# Patient Record
Sex: Male | Born: 1985 | Hispanic: Yes | Marital: Married | Smoking: Never smoker
Health system: Southern US, Community
[De-identification: ages and names within clinical notes are randomized; demographics above are authoritative.]

## PROBLEM LIST (undated history)

## (undated) DIAGNOSIS — J302 Other seasonal allergic rhinitis: Secondary | ICD-10-CM

## (undated) DIAGNOSIS — K091 Developmental (nonodontogenic) cysts of oral region: Secondary | ICD-10-CM

## (undated) DIAGNOSIS — I10 Essential (primary) hypertension: Secondary | ICD-10-CM

## (undated) HISTORY — PX: CYST EXCISION: SHX5701

## (undated) HISTORY — DX: Essential (primary) hypertension: I10

## (undated) HISTORY — PX: CRANIOTOMY: SHX93

---

## 2011-09-23 ENCOUNTER — Emergency Department (INDEPENDENT_AMBULATORY_CARE_PROVIDER_SITE_OTHER)
Admission: EM | Admit: 2011-09-23 | Discharge: 2011-09-23 | Disposition: A | Discharge status: Home / Self Care | Payer: Self-pay | Source: Home / Self Care | Attending: Emergency Medicine | Admitting: Emergency Medicine

## 2011-09-23 DIAGNOSIS — J45909 Unspecified asthma, uncomplicated: Secondary | ICD-10-CM

## 2011-09-23 MED ORDER — DOXYCYCLINE HYCLATE 100 MG PO TABS: 100.0000 mg | ORAL_TABLET | Freq: Two times a day (BID) | ORAL | Status: AC

## 2011-09-23 MED ORDER — GUAIFENESIN-CODEINE 100-10 MG/5ML PO SYRP
10.0000 mL | ORAL_SOLUTION | Freq: Four times a day (QID) | ORAL | Status: AC | PRN
Start: 2011-09-23 — End: 2011-10-03

## 2011-09-23 MED ORDER — PREDNISONE 10 MG PO TABS: ORAL_TABLET | ORAL | Status: DC

## 2011-09-23 MED ORDER — ALBUTEROL SULFATE HFA 108 (90 BASE) MCG/ACT IN AERS: 1.0000 | INHALATION_SPRAY | Freq: Four times a day (QID) | RESPIRATORY_TRACT | Status: DC | PRN

## 2011-09-23 NOTE — ED Notes (Signed)
C/o runny nose, slight cough and feels like it is hard to get deep breath for 2 days.  States initially he had sorethroat but that resolved.  Denies fever, n/v or diarrhea.

## 2011-09-23 NOTE — ED Provider Notes (Signed)
History     CSN: [id number]  Arrival date & time 09/23/11  1736   First MD Initiated Contact with Patient 09/23/11 1744      Chief Complaint  Patient presents with  . URI    (Consider location/radiation/quality/duration/timing/severity/associated sxs/prior treatment) HPI Comments: Sasan has had a two-day history of chest congestion, dry cough, wheezing, sore throat, nasal congestion, and rhinorrhea. He denies fever, chills, headache, chest pain, nausea, vomiting, or diarrhea. He has not been exposed to flu. He has not had a flu vaccine this year.  Patient is a 25 y.o. male presenting with URI.  URI The primary symptoms include sore throat and cough. Primary symptoms do not include fever, fatigue, ear pain, wheezing, abdominal pain, nausea, vomiting or rash.  Symptoms associated with the illness include congestion and rhinorrhea. The illness is not associated with chills.    History reviewed. No pertinent past medical history.  Past Surgical History  Procedure Date  . Brain surgery     History reviewed. No pertinent family history.  History  Substance Use Topics  . Smoking status: Current Some Day Smoker  . Smokeless tobacco: Not on file  . Alcohol Use: Yes      Review of Systems  Constitutional: Negative for fever, chills and fatigue.  HENT: Positive for congestion, sore throat and rhinorrhea. Negative for ear pain, sneezing, neck stiffness, voice change and postnasal drip.   Eyes: Negative for pain, discharge and redness.  Respiratory: Positive for cough and chest tightness. Negative for shortness of breath and wheezing.   Gastrointestinal: Negative for nausea, vomiting, abdominal pain and diarrhea.  Skin: Negative for rash.    Allergies  Review of patient's allergies indicates no known allergies.  Home Medications   Current Outpatient Rx  Name Route Sig Dispense Refill  . ALBUTEROL SULFATE HFA 108 (90 BASE) MCG/ACT IN AERS Inhalation Inhale 1-2 puffs into  the lungs every 6 (six) hours as needed for wheezing. 1 Inhaler 0  . DOXYCYCLINE HYCLATE 100 MG PO TABS Oral Take 1 tablet (100 mg total) by mouth 2 (two) times daily. 20 tablet 0  . GUAIFENESIN-CODEINE 100-10 MG/5ML PO SYRP Oral Take 10 mLs by mouth 4 (four) times daily as needed for cough. 120 mL 0  . PREDNISONE 10 MG PO TABS  Take 4 tabs daily for 4 days, 3 tabs daily for 4 days, 2 tabs daily for 4 days, then 1 tab daily for 4 days.  Take all tabs at one time with food and preferably in the morning except for the first dose. 40 tablet 0    BP 147/93  Pulse 97  Temp(Src) 98.6 F (37 C) (Oral)  Resp 18  SpO2 99%  Physical Exam  Nursing note and vitals reviewed. Constitutional: He appears well-developed and well-nourished. No distress.  HENT:  Head: Normocephalic and atraumatic.  Right Ear: External ear normal.  Left Ear: External ear normal.  Nose: Nose normal.  Mouth/Throat: Oropharynx is clear and moist. No oropharyngeal exudate.  Eyes: Conjunctivae and EOM are normal. Pupils are equal, round, and reactive to light. Right eye exhibits no discharge. Left eye exhibits no discharge.  Neck: Normal range of motion. Neck supple.  Cardiovascular: Normal rate, regular rhythm and normal heart sounds.   Pulmonary/Chest: Effort normal and breath sounds normal. No stridor. No respiratory distress. He has no wheezes. He has no rales. He exhibits no tenderness.  Lymphadenopathy:    He has no cervical adenopathy.  Skin: Skin is warm and dry. No rash  noted. He is not diaphoretic.    ED Course  Procedures (including critical care time)  Labs Reviewed - No data to display No results found.   1. Asthmatic bronchitis       MDM  He has asthmatic bronchitis. Will treat with doxycycline, guaifenesin/codeine cough syrup, albuterol, and a prednisone taper.        Roque Lias, MD 09/23/11 2107

## 2011-11-08 ENCOUNTER — Other Ambulatory Visit: Payer: Self-pay | Admitting: Family Medicine

## 2011-11-08 ENCOUNTER — Ambulatory Visit
Admission: RE | Admit: 2011-11-08 | Discharge: 2011-11-08 | Disposition: A | Discharge status: Home / Self Care | Payer: No Typology Code available for payment source | Source: Ambulatory Visit | Attending: Family Medicine | Admitting: Family Medicine

## 2011-11-08 DIAGNOSIS — R0602 Shortness of breath: Secondary | ICD-10-CM

## 2012-01-06 ENCOUNTER — Encounter: Payer: Self-pay | Admitting: Pulmonary Disease

## 2012-01-09 ENCOUNTER — Ambulatory Visit (INDEPENDENT_AMBULATORY_CARE_PROVIDER_SITE_OTHER): Payer: Self-pay | Admitting: Pulmonary Disease

## 2012-01-09 ENCOUNTER — Ambulatory Visit (INDEPENDENT_AMBULATORY_CARE_PROVIDER_SITE_OTHER)
Admission: RE | Admit: 2012-01-09 | Discharge: 2012-01-09 | Disposition: A | Discharge status: Home / Self Care | Payer: Self-pay | Source: Ambulatory Visit | Attending: Pulmonary Disease | Admitting: Pulmonary Disease

## 2012-01-09 ENCOUNTER — Encounter: Payer: Self-pay | Admitting: Pulmonary Disease

## 2012-01-09 VITALS — BP 122/72 | HR 97 | Temp 97.5°F | Ht 69.0 in | Wt 218.4 lb

## 2012-01-09 DIAGNOSIS — R0609 Other forms of dyspnea: Secondary | ICD-10-CM

## 2012-01-09 DIAGNOSIS — R0600 Dyspnea, unspecified: Secondary | ICD-10-CM

## 2012-01-09 DIAGNOSIS — R0989 Other specified symptoms and signs involving the circulatory and respiratory systems: Secondary | ICD-10-CM

## 2012-01-09 NOTE — Assessment & Plan Note (Signed)
He has chronic dyspnea since an episode of bronchitis in December 2012.  He has been tried on asthma therapy without much improvement.  There is no history of smoking or tobacco exposure.  His symptoms do not appear to be interfering with his daily activities too much.  Therefore will hold of on starting additional therapy at this time until more testing is done.  Will repeat chest xray today and schedule pulmonary functions tests.  Of note is that he reports starting lisinopril several months ago, and trial of this medicine may be warranted to see if this improves his symptoms.  Will defer this decision to primary care.

## 2012-01-09 NOTE — Progress Notes (Deleted)
  Subjective:    Patient ID: Ryan Perez, male    DOB: 11/22/1985, 26 y.o.   MRN: [id number]  HPI    Review of Systems  Constitutional: Negative for fever and appetite change.  HENT: Negative for ear pain, congestion, sore throat, trouble swallowing and dental problem.   Respiratory: Negative for cough and shortness of breath.   Cardiovascular: Negative for chest pain, palpitations and leg swelling.  Gastrointestinal: Negative for nausea and abdominal pain.  Musculoskeletal: Negative for joint swelling.  Skin: Negative for rash.  Neurological: Negative for headaches.  Psychiatric/Behavioral: Negative for dysphoric mood. The patient is not nervous/anxious.        Objective:   Physical Exam        Assessment & Plan:

## 2012-01-09 NOTE — Patient Instructions (Signed)
Chest xray today>>will call you with results Will schedule breathing test (PFT) Follow up in 2 to 3 weeks

## 2012-01-09 NOTE — Progress Notes (Signed)
Chief Complaint  Patient presents with  . Advice Only    Per Dr. Manus Gunning. Pt states its hard for him to take a deep breathe in x4 months. denies any pain when taking deep breathe.     History of Present Illness: Ryan Perez is a 26 y.o. male for evaluation of dyspnea.  His symptoms started in December of 2012.  He was seen in the ED for chest congestion, cough, sinus congestion, and wheezing.  He was treated for a bronchitis with prednisone taper and doxycycline.  Since then he has felt like he can't take deep breaths.  He has been tried on inhaler therapy and prednisone again since then, but these did not seem to help.  He is not having much cough or wheeze at present.  He denies chest pain or palpitations.  He has noticed trouble taking deep breaths and gets coughing spells when he runs.  This was not a problem before December 2012.  He was recently on a dieting aide, and this helped him lose 20 lbs.  This did not make any improvement with his breathing.  He has noticed some sinus congestion with change of seasons.  He denies globus sensation or reflux.  He denies joint swelling, gland swelling, or skin rash.  He denies smoking, and denies second hand tobacco exposures.  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  He is from [place], but has been living in [place] for 13 years.  He traveled to [place] last year, but denies other travel.  He denies recent sick exposures.  Of note is he was started on lisinopril for hypertension about 3 or 4 months ago.   Past Medical History  Diagnosis Date  . Hypertension     Past Surgical History  Procedure Date  . Brain surgery   . Nasal sinus surgery 2010    Current Outpatient Prescriptions on File Prior to Visit  Medication Sig Dispense Refill  . cetirizine (ZYRTEC) 10 MG tablet Take 10 mg by mouth daily.      Marland Kitchen lisinopril (PRINIVIL,ZESTRIL) 20 MG tablet Take 20 mg by mouth daily.          No Known Allergies  family history includes Diabetes in his father.   reports that he has never smoked. He has never used smokeless tobacco. He reports that he drinks alcohol. He reports that he does not use illicit drugs.  Review of Systems  Constitutional: Negative for fever and appetite change.  HENT: Negative for ear pain, congestion, sore throat, trouble swallowing and dental problem.   Respiratory: Negative for cough and shortness of breath.   Cardiovascular: Negative for chest pain, palpitations and leg swelling.  Gastrointestinal: Negative for nausea and abdominal pain.  Musculoskeletal: Negative for joint swelling.  Skin: Negative for rash.  Neurological: Negative for headaches.  Psychiatric/Behavioral: Negative for dysphoric mood. The patient is not nervous/anxious.     Physical Exam: BP 122/72  Pulse 97  Temp(Src) 97.5 F (36.4 C) (Oral)  Ht 5\' 9"  (1.753 m)  Wt 218 lb 6.4 oz (99.066 kg)  BMI 32.25 kg/m2  SpO2 97%  Body mass index is 32.25 kg/(m^2).  General - Obese HEENT - PERRLA, EOMI, no sinus tenderness, nasal septal deviation, no oral exudate, MP 4, no LAN, no thyromegaly Cardiac - s1s2 regular, no murmur, pulses symmetric Chest - good air entry, normal respiratory excursion, no wheeze/rales/dullness Abdomen - soft, nontender, + bowel sounds, no organomegaly Extremities -  no e/c/c Neurologic - normal strength, CN intact Skin - no rashes Psychiatric - normal mood, behavior  Assessment/Plan:  Outpatient Encounter Prescriptions as of 01/09/2012  Medication Sig Dispense Refill  . cetirizine (ZYRTEC) 10 MG tablet Take 10 mg by mouth daily.      Marland Kitchen lisinopril (PRINIVIL,ZESTRIL) 20 MG tablet Take 20 mg by mouth daily.      Marland Kitchen DISCONTD: albuterol (PROVENTIL HFA;VENTOLIN HFA) 108 (90 BASE) MCG/ACT inhaler Inhale 1-2 puffs into the lungs every 6 (six) hours as needed for wheezing.  1 Inhaler  0  . DISCONTD: FLUOXETINE HCL PO Take 10 mg by mouth daily.      Marland Kitchen  DISCONTD: predniSONE (DELTASONE) 10 MG tablet Take 4 tabs daily for 4 days, 3 tabs daily for 4 days, 2 tabs daily for 4 days, then 1 tab daily for 4 days.  Take all tabs at one time with food and preferably in the morning except for the first dose.  40 tablet  0    Ryan Perez Pager:  [phone] 01/09/2012, 10:06 AM

## 2012-01-11 ENCOUNTER — Telehealth: Payer: Self-pay | Admitting: Pulmonary Disease

## 2012-01-11 NOTE — Telephone Encounter (Signed)
Dg Chest 2 View  01/09/2012  *RADIOLOGY REPORT*   Clinical Data: Shortness of breath which is progressive over the last 4 months, no smoking history   CHEST - 2 VIEW   Comparison: Chest x-ray of 11/08/2011  Findings: No pneumonia is seen.  There is some peribronchial thickening which may indicate bronchitis.  Mediastinal contours are stable.  The heart is within normal limits in size.  No bony abnormality is seen.   IMPRESSION: No active lung disease.  Mild peribronchial thickening.   Original Report Authenticated By: Juline Patch, M.D.    Will have my nurse inform patient that chest xray okay.  Will discuss further at next visit after review of PFT's.  No change to current treatment plan.

## 2012-01-11 NOTE — Telephone Encounter (Signed)
I spoke with patient about results and he verbalized understanding and had no questions 

## 2012-02-01 ENCOUNTER — Ambulatory Visit: Payer: Self-pay | Admitting: Pulmonary Disease

## 2012-12-03 ENCOUNTER — Ambulatory Visit (HOSPITAL_COMMUNITY)
Admission: RE | Admit: 2012-12-03 | Discharge: 2012-12-03 | Disposition: A | Discharge status: Home / Self Care | Payer: Self-pay | Source: Ambulatory Visit | Attending: Pediatrics | Admitting: Pediatrics

## 2012-12-03 ENCOUNTER — Other Ambulatory Visit (HOSPITAL_COMMUNITY): Payer: Self-pay | Admitting: Pediatrics

## 2012-12-03 DIAGNOSIS — R05 Cough: Secondary | ICD-10-CM

## 2012-12-03 DIAGNOSIS — R059 Cough, unspecified: Secondary | ICD-10-CM

## 2014-07-10 ENCOUNTER — Other Ambulatory Visit: Payer: Self-pay | Admitting: Otolaryngology

## 2014-07-10 ENCOUNTER — Other Ambulatory Visit (HOSPITAL_COMMUNITY): Payer: Self-pay | Admitting: Otolaryngology

## 2014-07-10 DIAGNOSIS — J341 Cyst and mucocele of nose and nasal sinus: Secondary | ICD-10-CM

## 2014-07-15 ENCOUNTER — Ambulatory Visit (HOSPITAL_COMMUNITY)
Admission: RE | Admit: 2014-07-15 | Discharge: 2014-07-15 | Disposition: A | Discharge status: Home / Self Care | Payer: No Typology Code available for payment source | Source: Ambulatory Visit | Attending: Otolaryngology | Admitting: Otolaryngology

## 2014-07-15 DIAGNOSIS — J341 Cyst and mucocele of nose and nasal sinus: Secondary | ICD-10-CM | POA: Diagnosis present

## 2014-12-04 IMAGING — CT CT MAXILLOFACIAL W/O CM
3 series · 16 of 47 positions shown, 19 images · non-contrast
Comparison: None.

CLINICAL DATA: Nasal cavity cyst

EXAM:
CT MAXILLOFACIAL WITHOUT CONTRAST
TECHNIQUE: Multidetector CT imaging of the maxillofacial structures was
performed. Multiplanar CT image reconstructions were also generated.
A small metallic BB was placed on the right temple in order to
reliably differentiate right from left.

[Series 2: facial/ orbits 2.0 h30s · axial · 0.42mm/px · z∈[-148,-6]mm · 10 of 83 slices shown, 13 images]
[im 6/83  brain]
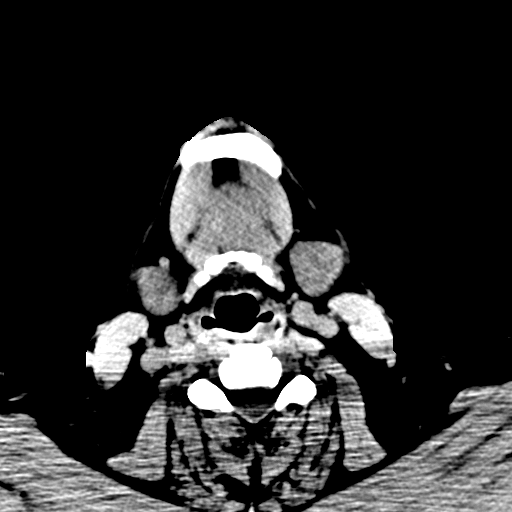
[im 6/83  bone]
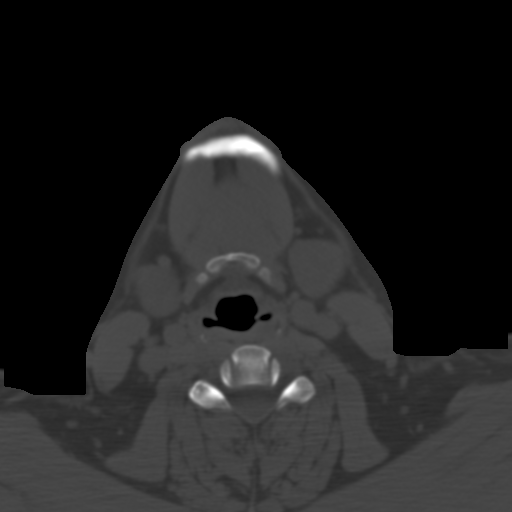
[im 15/83  bone]
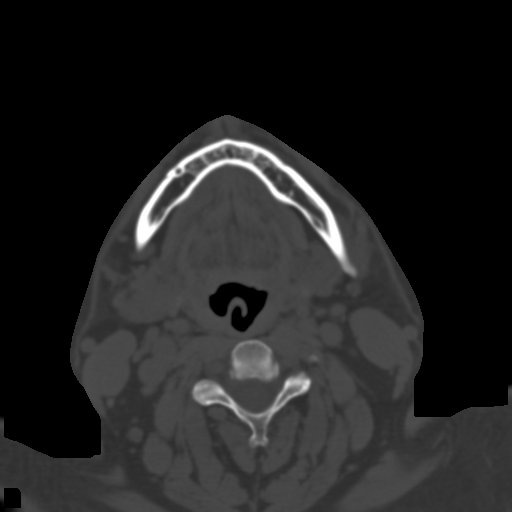
[im 23/83  bone]
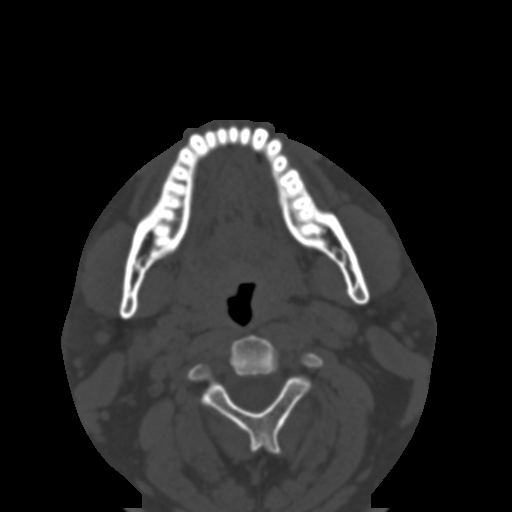
[im 29/83  bone]
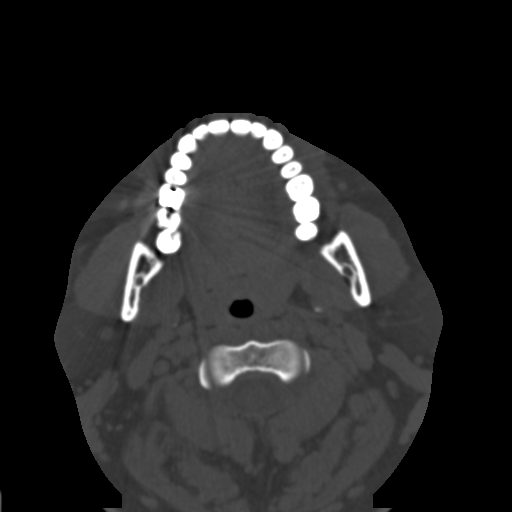
[im 37/83  brain]
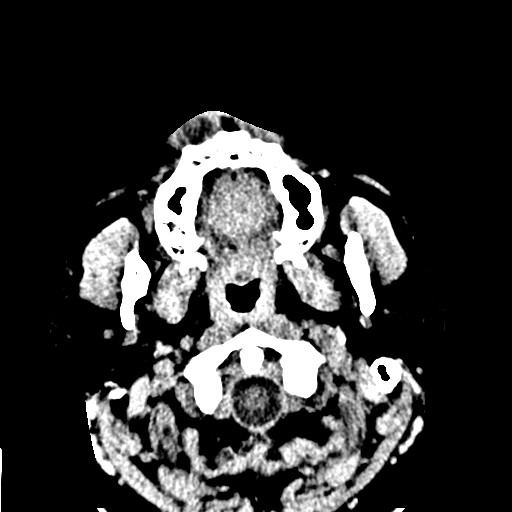
[im 37/83  bone]
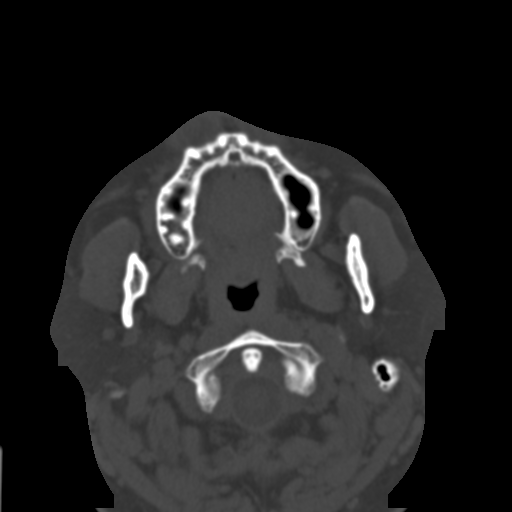
[im 46/83  bone]
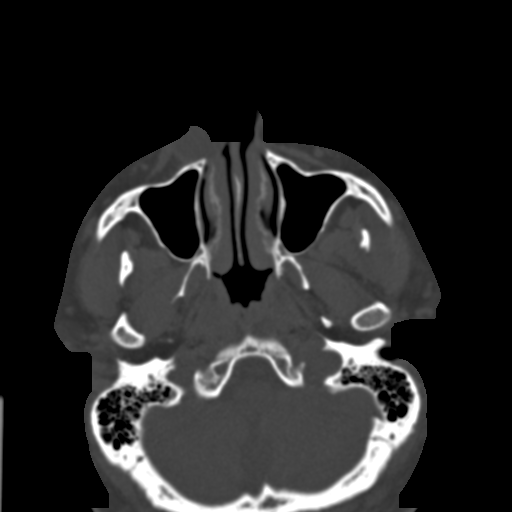
[im 54/83  bone]
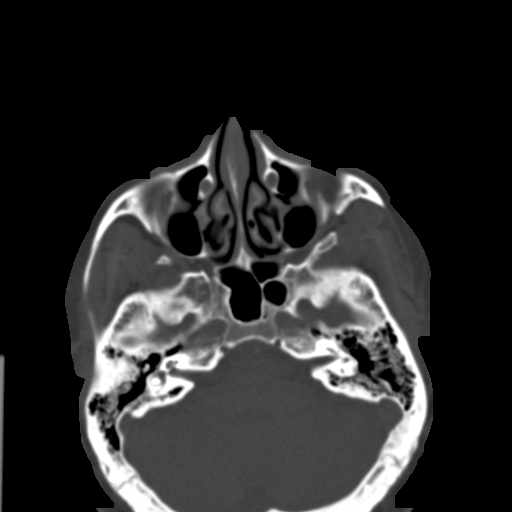
[im 63/83  bone]
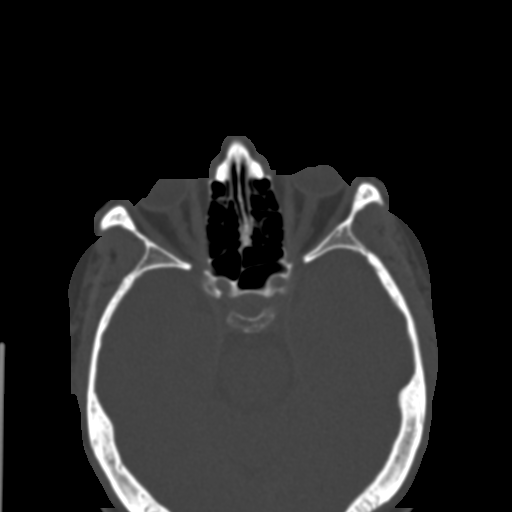
[im 68/83  brain]
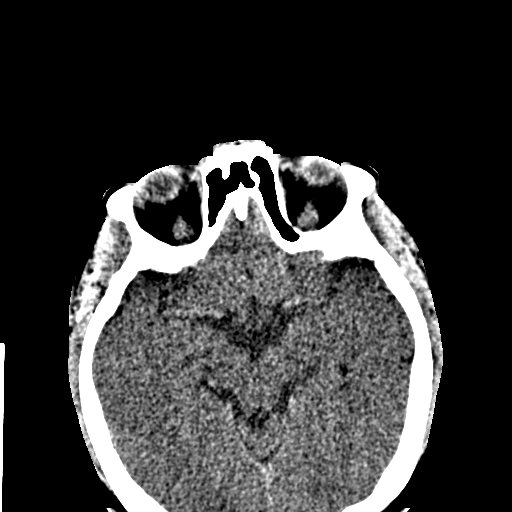
[im 68/83  bone]
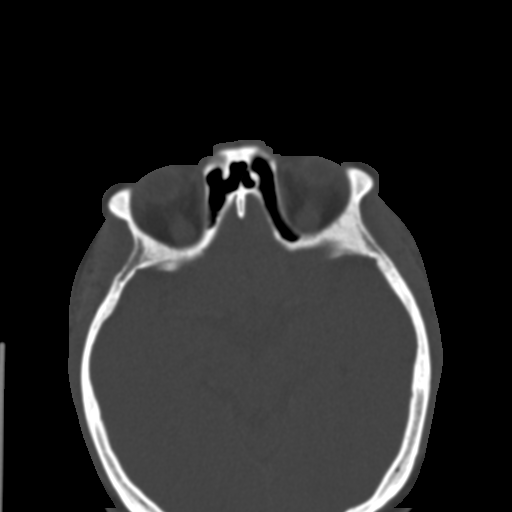
[im 77/83  bone]
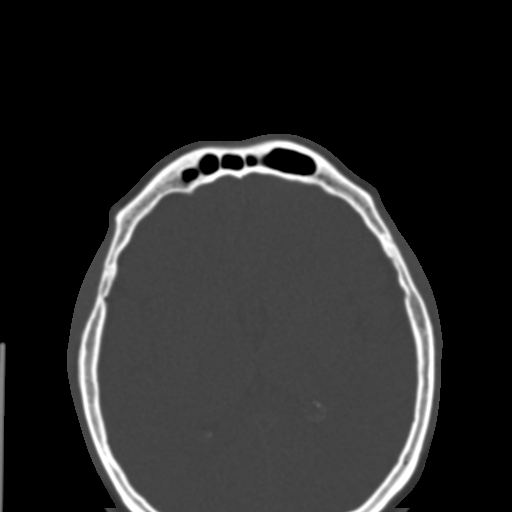

[Series 6: coronal soft tissue · coronal · 0.38mm/px · 3 of 80 slices shown]
[im 27/80  bone]
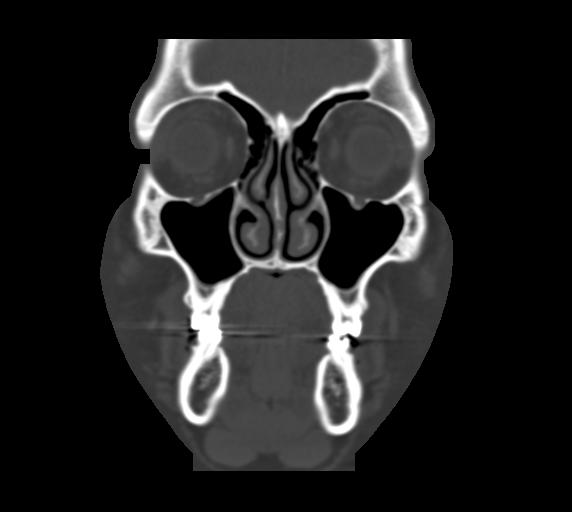
[im 36/80  bone]
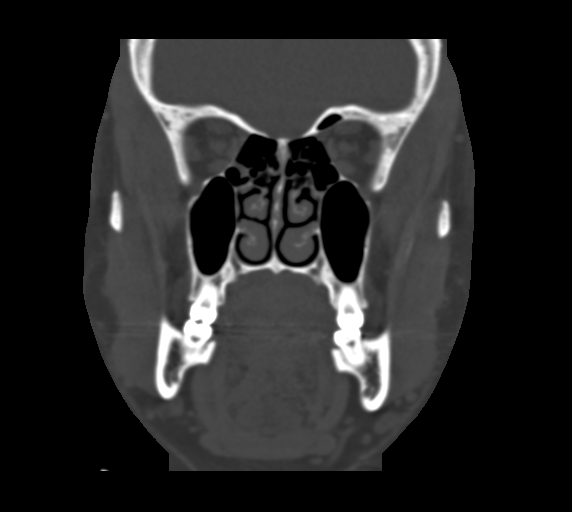
[im 44/80  bone]
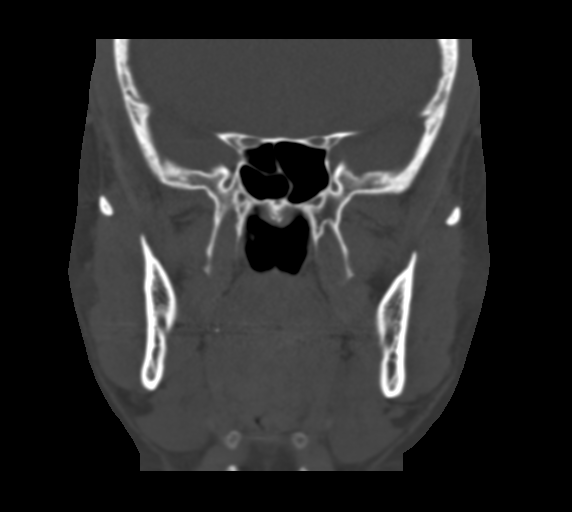

[Series 7: sagittal soft tissue · sagittal · 0.37mm/px · 3 of 87 slices shown]
[im 29/87  bone]
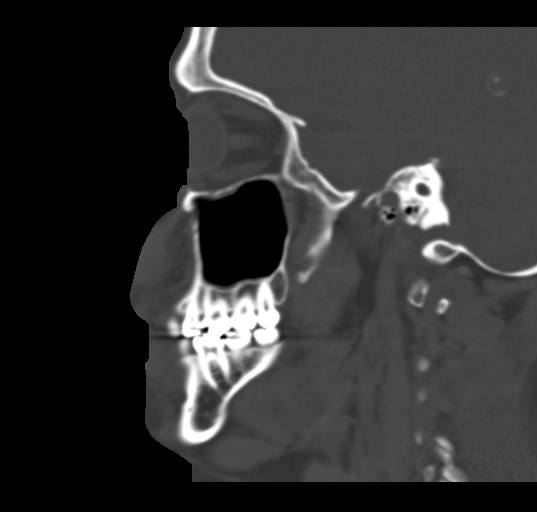
[im 44/87  bone]
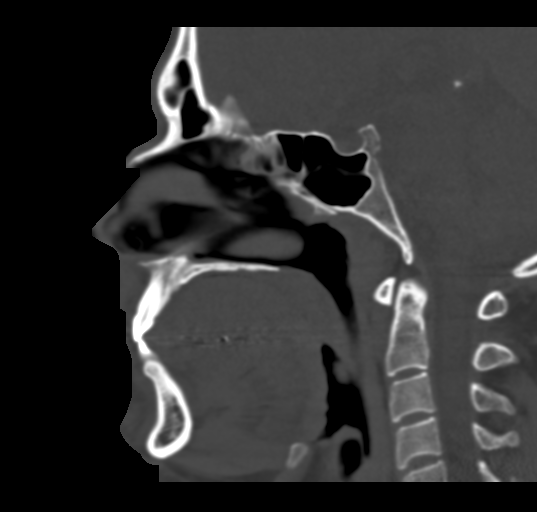
[im 58/87  bone]
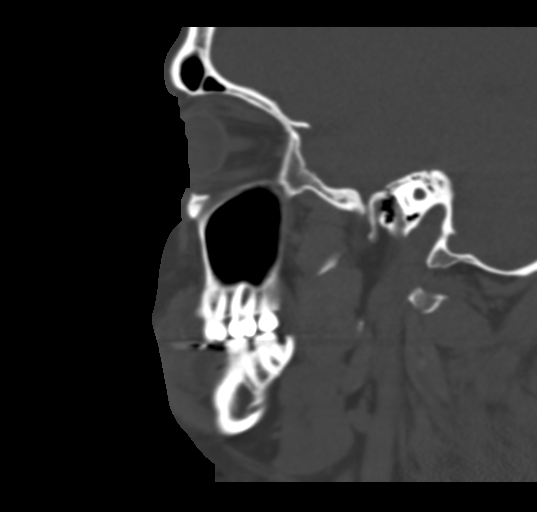

[16 of 47 positions shown; findings below may reference images not displayed]

FINDINGS: Subcutaneous cyst adjacent to the right nares measures 22 mm and has
homogeneous water density. No associated calcification. This cyst
projects into the right nasal cavity. No bony destruction or bony
remodeling is noted. Nasal septum is normal.

Mild mucosal edema in the maxillary and ethmoid sinuses bilaterally.
Frontal and sphenoid sinuses are clear. No air-fluid level. Mastoid
sinus clear bilaterally.
IMPRESSION: Subcutaneous cyst adjacent to the right nares with projection into
the right nasal cavity. No bony changes identified. This may
represent a sebaceous cyst or other benign dermal cyst.

Mild mucosal edema paranasal sinuses.

## 2015-02-25 DIAGNOSIS — K091 Developmental (nonodontogenic) cysts of oral region: Secondary | ICD-10-CM

## 2015-02-25 HISTORY — DX: Developmental (nonodontogenic) cysts of oral region: K09.1

## 2015-03-03 ENCOUNTER — Encounter (HOSPITAL_BASED_OUTPATIENT_CLINIC_OR_DEPARTMENT_OTHER): Payer: Self-pay | Admitting: *Deleted

## 2015-03-03 NOTE — Pre-Procedure Instructions (Signed)
To come for BMET and EKG 

## 2015-03-04 ENCOUNTER — Other Ambulatory Visit: Payer: Self-pay

## 2015-03-04 ENCOUNTER — Encounter (HOSPITAL_BASED_OUTPATIENT_CLINIC_OR_DEPARTMENT_OTHER)
Admission: RE | Admit: 2015-03-04 | Discharge: 2015-03-04 | Disposition: A | Discharge status: Home / Self Care | Payer: 59 | Source: Ambulatory Visit | Attending: Otolaryngology | Admitting: Otolaryngology

## 2015-03-04 DIAGNOSIS — Z01818 Encounter for other preprocedural examination: Secondary | ICD-10-CM | POA: Insufficient documentation

## 2015-03-04 LAB — BASIC METABOLIC PANEL
Anion gap: 7 (ref 5–15)
BUN: 9 mg/dL (ref 6–20)
CALCIUM: 9.5 mg/dL (ref 8.9–10.3)
CO2: 25 mmol/L (ref 22–32)
Chloride: 105 mmol/L (ref 101–111)
Creatinine, Ser: 0.71 mg/dL (ref 0.61–1.24)
GFR calc Af Amer: >60 mL/min (ref >60)
GFR calc non Af Amer: >60 mL/min (ref >60)
GLUCOSE: 139 mg/dL — AB (ref 65–99)
Potassium: 4.4 mmol/L (ref 3.5–5.1)
Sodium: 137 mmol/L (ref 135–145)

## 2015-03-06 NOTE — Pre-Procedure Instructions (Signed)
Dorita will be interpreter for pt., per Judy at [facility]; please call [phone] if surgery time changes. 

## 2015-03-09 ENCOUNTER — Ambulatory Visit (HOSPITAL_BASED_OUTPATIENT_CLINIC_OR_DEPARTMENT_OTHER)
Admission: RE | Admit: 2015-03-09 | Discharge: 2015-03-09 | Disposition: A | Discharge status: Home / Self Care | Payer: 59 | Source: Ambulatory Visit | Attending: Otolaryngology | Admitting: Otolaryngology

## 2015-03-09 ENCOUNTER — Ambulatory Visit (HOSPITAL_BASED_OUTPATIENT_CLINIC_OR_DEPARTMENT_OTHER): Payer: 59 | Admitting: Anesthesiology

## 2015-03-09 ENCOUNTER — Encounter (HOSPITAL_BASED_OUTPATIENT_CLINIC_OR_DEPARTMENT_OTHER)
Admission: RE | Disposition: A | Discharge status: Home / Self Care | Payer: Self-pay | Source: Ambulatory Visit | Attending: Otolaryngology

## 2015-03-09 ENCOUNTER — Encounter (HOSPITAL_BASED_OUTPATIENT_CLINIC_OR_DEPARTMENT_OTHER): Payer: Self-pay

## 2015-03-09 DIAGNOSIS — I10 Essential (primary) hypertension: Secondary | ICD-10-CM | POA: Diagnosis not present

## 2015-03-09 DIAGNOSIS — J341 Cyst and mucocele of nose and nasal sinus: Secondary | ICD-10-CM | POA: Insufficient documentation

## 2015-03-09 HISTORY — DX: Other seasonal allergic rhinitis: J30.2

## 2015-03-09 HISTORY — DX: Developmental (nonodontogenic) cysts of oral region: K09.1

## 2015-03-09 HISTORY — PX: EXCISION NASAL MASS: SHX6271

## 2015-03-09 LAB — POCT HEMOGLOBIN-HEMACUE: HEMOGLOBIN: 14.7 g/dL (ref 13.0–17.0)

## 2015-03-09 SURGERY — EXCISION, MASS, NOSE
Anesthesia: General | Site: Mouth | Laterality: Right

## 2015-03-09 MED ORDER — FENTANYL CITRATE (PF) 100 MCG/2ML IJ SOLN
INTRAMUSCULAR | Status: AC
  Filled 2015-03-09: qty 6

## 2015-03-09 MED ORDER — OXYCODONE HCL 5 MG PO TABS
5.0000 mg | ORAL_TABLET | Freq: Once | ORAL | Status: AC
  Administered 2015-03-09: 5 mg via ORAL

## 2015-03-09 MED ORDER — PROPOFOL 10 MG/ML IV BOLUS
INTRAVENOUS | Status: DC | PRN
  Administered 2015-03-09: 300 mg via INTRAVENOUS
  Administered 2015-03-09: 30 mg via INTRAVENOUS

## 2015-03-09 MED ORDER — MIDAZOLAM HCL 2 MG/2ML IJ SOLN
INTRAMUSCULAR | Status: AC
  Filled 2015-03-09: qty 2

## 2015-03-09 MED ORDER — FENTANYL CITRATE (PF) 100 MCG/2ML IJ SOLN: 50.0000 ug | INTRAMUSCULAR | Status: DC | PRN

## 2015-03-09 MED ORDER — HYDROCODONE-ACETAMINOPHEN 5-325 MG PO TABS: 1.0000 | ORAL_TABLET | Freq: Four times a day (QID) | ORAL | Status: DC | PRN

## 2015-03-09 MED ORDER — OXYMETAZOLINE HCL 0.05 % NA SOLN
NASAL | Status: DC | PRN
  Administered 2015-03-09: 1 via TOPICAL

## 2015-03-09 MED ORDER — BACITRACIN ZINC 500 UNIT/GM EX OINT
TOPICAL_OINTMENT | CUTANEOUS | Status: AC
  Filled 2015-03-09: qty 28.35

## 2015-03-09 MED ORDER — GLYCOPYRROLATE 0.2 MG/ML IJ SOLN: 0.2000 mg | Freq: Once | INTRAMUSCULAR | Status: DC | PRN

## 2015-03-09 MED ORDER — CEFAZOLIN SODIUM-DEXTROSE 2-3 GM-% IV SOLR
INTRAVENOUS | Status: DC | PRN
  Administered 2015-03-09: 2 g via INTRAVENOUS

## 2015-03-09 MED ORDER — MIDAZOLAM HCL 5 MG/5ML IJ SOLN
INTRAMUSCULAR | Status: DC | PRN
  Administered 2015-03-09: 2 mg via INTRAVENOUS

## 2015-03-09 MED ORDER — LACTATED RINGERS IV SOLN
INTRAVENOUS | Status: DC
Start: 2015-03-09 — End: 2015-03-09
  Administered 2015-03-09 (×3): via INTRAVENOUS

## 2015-03-09 MED ORDER — PROMETHAZINE HCL 25 MG/ML IJ SOLN: 6.2500 mg | INTRAMUSCULAR | Status: DC | PRN

## 2015-03-09 MED ORDER — ONDANSETRON HCL 4 MG/2ML IJ SOLN
INTRAMUSCULAR | Status: DC | PRN
  Administered 2015-03-09: 4 mg via INTRAVENOUS

## 2015-03-09 MED ORDER — OXYMETAZOLINE HCL 0.05 % NA SOLN
NASAL | Status: AC
  Filled 2015-03-09: qty 15

## 2015-03-09 MED ORDER — MUPIROCIN 2 % EX OINT
TOPICAL_OINTMENT | CUTANEOUS | Status: AC
  Filled 2015-03-09: qty 22

## 2015-03-09 MED ORDER — DEXAMETHASONE SODIUM PHOSPHATE 4 MG/ML IJ SOLN
INTRAMUSCULAR | Status: DC | PRN
  Administered 2015-03-09: 10 mg via INTRAVENOUS

## 2015-03-09 MED ORDER — MIDAZOLAM HCL 2 MG/2ML IJ SOLN: 0.5000 mg | Freq: Once | INTRAMUSCULAR | Status: DC | PRN

## 2015-03-09 MED ORDER — FENTANYL CITRATE (PF) 100 MCG/2ML IJ SOLN
INTRAMUSCULAR | Status: DC | PRN
  Administered 2015-03-09: 100 ug via INTRAVENOUS

## 2015-03-09 MED ORDER — EPHEDRINE SULFATE 50 MG/ML IJ SOLN
INTRAMUSCULAR | Status: DC | PRN
  Administered 2015-03-09: 10 mg via INTRAVENOUS

## 2015-03-09 MED ORDER — LIDOCAINE-EPINEPHRINE 1 %-1:100000 IJ SOLN
INTRAMUSCULAR | Status: AC
  Filled 2015-03-09: qty 1

## 2015-03-09 MED ORDER — LIDOCAINE HCL (CARDIAC) 20 MG/ML IV SOLN
INTRAVENOUS | Status: DC | PRN
  Administered 2015-03-09: 50 mg via INTRAVENOUS

## 2015-03-09 MED ORDER — LIDOCAINE-EPINEPHRINE 1 %-1:100000 IJ SOLN
INTRAMUSCULAR | Status: DC | PRN
  Administered 2015-03-09: 4 mL

## 2015-03-09 MED ORDER — HYDROMORPHONE HCL 1 MG/ML IJ SOLN: 0.2500 mg | INTRAMUSCULAR | Status: DC | PRN

## 2015-03-09 MED ORDER — BACITRACIN ZINC 500 UNIT/GM EX OINT
TOPICAL_OINTMENT | CUTANEOUS | Status: DC | PRN
  Administered 2015-03-09: 1 via TOPICAL

## 2015-03-09 MED ORDER — MEPERIDINE HCL 25 MG/ML IJ SOLN: 6.2500 mg | INTRAMUSCULAR | Status: DC | PRN

## 2015-03-09 MED ORDER — CEPHALEXIN 500 MG PO CAPS: 500.0000 mg | ORAL_CAPSULE | Freq: Three times a day (TID) | ORAL | Status: DC

## 2015-03-09 MED ORDER — MIDAZOLAM HCL 2 MG/2ML IJ SOLN: 1.0000 mg | INTRAMUSCULAR | Status: DC | PRN

## 2015-03-09 MED ORDER — OXYCODONE HCL 5 MG PO TABS
ORAL_TABLET | ORAL | Status: AC
  Filled 2015-03-09: qty 1

## 2015-03-09 SURGICAL SUPPLY — 40 items
ATTRACTOMAT 16X20 MAGNETIC DRP (DRAPES) IMPLANT
CANISTER SUCT 1200ML W/VALVE (MISCELLANEOUS) ×2 IMPLANT
COAGULATOR SUCT SWTCH 10FR 6 (ELECTROSURGICAL) IMPLANT
CORDS BIPOLAR (ELECTRODE) IMPLANT
DECANTER SPIKE VIAL GLASS SM (MISCELLANEOUS) IMPLANT
DRAIN PENROSE 1/4X12 LTX STRL (WOUND CARE) ×2 IMPLANT
DRSG TELFA 3X8 NADH (GAUZE/BANDAGES/DRESSINGS) IMPLANT
ELECT COATED BLADE 2.86 ST (ELECTRODE) ×2 IMPLANT
ELECT NEEDLE BLADE 2-5/6 (NEEDLE) IMPLANT
ELECT REM PT RETURN 9FT ADLT (ELECTROSURGICAL) ×2
ELECTRODE REM PT RTRN 9FT ADLT (ELECTROSURGICAL) ×1 IMPLANT
GLOVE BIO SURGEON STRL SZ7.5 (GLOVE) ×2 IMPLANT
GLOVE BIOGEL PI IND STRL 7.0 (GLOVE) ×1 IMPLANT
GLOVE BIOGEL PI INDICATOR 7.0 (GLOVE) ×1
GLOVE ECLIPSE 6.5 STRL STRAW (GLOVE) ×2 IMPLANT
GLOVE EXAM NITRILE EXT CUFF MD (GLOVE) ×2 IMPLANT
GOWN STRL REUS W/ TWL LRG LVL3 (GOWN DISPOSABLE) ×2 IMPLANT
GOWN STRL REUS W/TWL LRG LVL3 (GOWN DISPOSABLE) ×2
HEMOSTAT SURGICEL .5X2 ABSORB (HEMOSTASIS) IMPLANT
NEEDLE PRECISIONGLIDE 27X1.5 (NEEDLE) ×2 IMPLANT
NS IRRIG 1000ML POUR BTL (IV SOLUTION) ×2 IMPLANT
PACK BASIN DAY SURGERY FS (CUSTOM PROCEDURE TRAY) ×2 IMPLANT
PACK ENT DAY SURGERY (CUSTOM PROCEDURE TRAY) ×2 IMPLANT
PATTIES SURGICAL .5 X3 (DISPOSABLE) ×2 IMPLANT
PENCIL BUTTON HOLSTER BLD 10FT (ELECTRODE) ×2 IMPLANT
SHEET SILASTIC 8X6X.030 25-30 (MISCELLANEOUS) IMPLANT
SPLINT NASAL AIRWAY SILICONE (MISCELLANEOUS) IMPLANT
SPONGE GAUZE 2X2 8PLY STRL LF (GAUZE/BANDAGES/DRESSINGS) ×2 IMPLANT
SUT CHROMIC 2 0 SH (SUTURE) IMPLANT
SUT CHROMIC 4 0 P 3 18 (SUTURE) IMPLANT
SUT ETHILON 3 0 PS 1 (SUTURE) IMPLANT
SUT MON AB 3-0 SH 27 (SUTURE) ×1
SUT MON AB 3-0 SH27 (SUTURE) ×1 IMPLANT
SUT PLAIN 4 0 ~~LOC~~ 1 (SUTURE) IMPLANT
SUT SILK 2 0 SH (SUTURE) ×2 IMPLANT
SUT VIC AB 3-0 SH 27 (SUTURE) ×1
SUT VIC AB 3-0 SH 27X BRD (SUTURE) ×1 IMPLANT
TOWEL OR 17X24 6PK STRL BLUE (TOWEL DISPOSABLE) ×4 IMPLANT
TRAY DSU PREP LF (CUSTOM PROCEDURE TRAY) ×2 IMPLANT
YANKAUER SUCT BULB TIP NO VENT (SUCTIONS) IMPLANT

## 2015-03-09 NOTE — Brief Op Note (Signed)
03/09/2015  11:42 AM  PATIENT:  Ryan Perez  29 y.o. male  PRE-OPERATIVE DIAGNOSIS:  LATERAL NASAL CYST  POST-OPERATIVE DIAGNOSIS:  LATERAL NASAL CYST  PROCEDURE:  Procedure(s) with comments: SUBLABIAL EXCISION OF LATERAL NASAL CYST  (Right) - nasal and oral  SURGEON:  Surgeon(s) and Role:    * Christia Reading, MD - Primary  PHYSICIAN ASSISTANT:   ASSISTANTS: none   ANESTHESIA:   general  EBL:  Total I/O In: 2000 [I.V.:2000] Out: -   BLOOD ADMINISTERED:none  DRAINS: Penrose drain in the sublabial   LOCAL MEDICATIONS USED:  LIDOCAINE   SPECIMEN:  Source of Specimen:  Right lateral nasal cyst  DISPOSITION OF SPECIMEN:  PATHOLOGY  COUNTS:  YES  TOURNIQUET:  * No tourniquets in log *  DICTATION: .Other Dictation: Dictation Number [id number]  PLAN OF CARE: Discharge to home after PACU  PATIENT DISPOSITION:  PACU - hemodynamically stable.   Delay start of Pharmacological VTE agent (>24hrs) due to surgical blood loss or risk of bleeding: no

## 2015-03-09 NOTE — Anesthesia Procedure Notes (Signed)
Procedure Name: LMA Insertion Date/Time: 03/09/2015 10:37 AM Performed by: Genevieve Norlander L Pre-anesthesia Checklist: Patient identified, Emergency Drugs available, Suction available and Patient being monitored Patient Re-evaluated:Patient Re-evaluated prior to inductionOxygen Delivery Method: Circle System Utilized Preoxygenation: Pre-oxygenation with 100% oxygen Intubation Type: IV induction Ventilation: Mask ventilation without difficulty LMA: LMA inserted LMA Size: 5.0 Number of attempts: 1 Airway Equipment and Method: Bite block Placement Confirmation: positive ETCO2 Tube secured with: Tape Dental Injury: Teeth and Oropharynx as per pre-operative assessment

## 2015-03-09 NOTE — H&P (Signed)
Ryan Perez is an 29 y.o. male.   Chief Complaint: Right lateral nasal cyst HPI: 29 year old with right lateral nasal cyst that was removed previously in [place] but recurred.  There was drainage from the right nasal passage with recurrence.  Underlying bony structures appear normal on CT imaging.  He presents for sublabial excision.  Past Medical History  Diagnosis Date  . Hypertension     states under control with med., has been on med. > 3 yr.  . Seasonal allergies   . Cyst, nasolabial 02/2015    right lateral    Past Surgical History  Procedure Laterality Date  . Craniotomy  age 36    head trauma - fell from horse  . Cyst excision Right     nose    Family History  Problem Relation Age of Onset  . Diabetes Father    Social History:  reports that he has never smoked. He has never used smokeless tobacco. He reports that he drinks alcohol. He reports that he does not use illicit drugs.  Allergies: No Known Allergies  Medications Prior to Admission  Medication Sig Dispense Refill  . lisinopril (PRINIVIL,ZESTRIL) 20 MG tablet Take 20 mg by mouth daily.      Results for orders placed or performed during the hospital encounter of 03/09/15 (from the past 48 hour(s))  Hemoglobin-hemacue, POC     Status: None   Collection Time: 03/09/15  9:11 AM  Result Value Ref Range   Hemoglobin 14.7 13.0 - 17.0 g/dL   No results found.  Review of Systems  All other systems reviewed and are negative.   Blood pressure 150/82, pulse 90, temperature 98.1 F (36.7 C), temperature source Oral, resp. rate 20, height 5\' 9"  (1.753 m), weight 106.505 kg (234 lb 12.8 oz), SpO2 100 %. Physical Exam  Constitutional: He is oriented to person, place, and time. He appears well-developed and well-nourished. No distress.  HENT:  Head: Normocephalic and atraumatic.  Right Ear: External ear normal.  Left Ear: External ear normal.  Mouth/Throat: Oropharynx is clear and moist.  Right medial  cheek/lateral nose with mild fullness, right nasal ala mildly deformed.  Eyes: Conjunctivae and EOM are normal. Pupils are equal, round, and reactive to light.  Neck: Normal range of motion. Neck supple.  Cardiovascular: Normal rate.   Respiratory: Effort normal.  Musculoskeletal: Normal range of motion.  Neurological: He is alert and oriented to person, place, and time. No cranial nerve deficit.  Skin: Skin is warm and dry.  Psychiatric: He has a normal mood and affect. His behavior is normal. Judgment and thought content normal.     Assessment/Plan Right lateral nasal cyst To OR for sublabial excision of right lateral nasal cyst.  Anapaola Kinsel 03/09/2015, 10:22 AM

## 2015-03-09 NOTE — Transfer of Care (Signed)
Immediate Anesthesia Transfer of Care Note  Patient: Ryan Perez  Procedure(s) Performed: Procedure(s) with comments: SUBLABIAL EXCISION OF LATERAL NASAL CYST  (Right) - nasal and oral  Patient Location: PACU  Anesthesia Type:General  Level of Consciousness: awake and patient cooperative  Airway & Oxygen Therapy: Patient Spontanous Breathing and Patient connected to face mask oxygen  Post-op Assessment: Report given to RN and Post -op Vital signs reviewed and stable  Post vital signs: Reviewed and stable  Last Vitals:  Filed Vitals:   03/09/15 0901  BP: 150/82  Pulse: 90  Temp: 36.7 C  Resp: 20    Complications: No apparent anesthesia complications

## 2015-03-09 NOTE — Discharge Instructions (Signed)

## 2015-03-09 NOTE — Anesthesia Preprocedure Evaluation (Addendum)
Anesthesia Evaluation  Patient identified by MRN, date of birth, ID band Patient awake    Reviewed: Allergy & Precautions, NPO status , Patient's Chart, lab work & pertinent test results  History of Anesthesia Complications Negative for: history of anesthetic complications  Airway Mallampati: III  TM Distance: >3 FB Neck ROM: Full    Dental  (+) Dental Advisory Given   Pulmonary neg pulmonary ROS,  breath sounds clear to auscultation        Cardiovascular hypertension, Pt. on medications Rhythm:Regular Rate:Normal     Neuro/Psych S/p craniotomy for trauma age 29yo    GI/Hepatic negative GI ROS, Neg liver ROS,   Endo/Other  Morbid obesity  Renal/GU negative Renal ROS     Musculoskeletal   Abdominal (+) + obese,   Peds  Hematology negative hematology ROS (+)   Anesthesia Other Findings   Reproductive/Obstetrics                            Anesthesia Physical Anesthesia Plan  ASA: II  Anesthesia Plan: General   Post-op Pain Management:    Induction: Intravenous  Airway Management Planned: LMA  Additional Equipment:   Intra-op Plan:   Post-operative Plan:   Informed Consent: I have reviewed the patients History and Physical, chart, labs and discussed the procedure including the risks, benefits and alternatives for the proposed anesthesia with the patient or authorized representative who has indicated his/her understanding and acceptance.   Dental advisory given  Plan Discussed with: CRNA and Surgeon  Anesthesia Plan Comments: (Plan routine monitors, GA- LMA OK)        Anesthesia Quick Evaluation

## 2015-03-09 NOTE — Anesthesia Postprocedure Evaluation (Signed)
  Anesthesia Post-op Note  Patient: Ryan Perez  Procedure(s) Performed: Procedure(s) with comments: SUBLABIAL EXCISION OF LATERAL NASAL CYST  (Right) - nasal and oral  Patient Location: PACU  Anesthesia Type:General  Level of Consciousness: awake, alert , oriented and patient cooperative  Airway and Oxygen Therapy: Patient Spontanous Breathing  Post-op Pain: mild  Post-op Assessment: Post-op Vital signs reviewed, Patient's Cardiovascular Status Stable, Respiratory Function Stable, Patent Airway, No signs of Nausea or vomiting and Pain level controlled              Post-op Vital Signs: Reviewed and stable  Last Vitals:  Filed Vitals:   03/09/15 1245  BP: 136/78  Pulse: 81  Temp:   Resp: 13    Complications: No apparent anesthesia complications

## 2015-03-10 ENCOUNTER — Encounter (HOSPITAL_BASED_OUTPATIENT_CLINIC_OR_DEPARTMENT_OTHER): Payer: Self-pay | Admitting: Otolaryngology

## 2015-03-10 NOTE — Op Note (Signed)
NAME:  DUFF, POZZI NO.:  [id number]  MEDICAL RECORD NO.:  [id number]  LOCATION:                               FACILITY:  [facility]  PHYSICIAN:  Antony Contras, MD     DATE OF BIRTH:  March 21, 1986  DATE OF PROCEDURE:  03/09/2015 DATE OF DISCHARGE:  03/09/2015                              OPERATIVE REPORT   PREOPERATIVE DIAGNOSIS:  Right lateral nasal cyst.  POSTOPERATIVE DIAGNOSIS:  Right lateral nasal cyst.  PROCEDURE:  Sublabial excision of right lateral nasal cyst with intranasal excision.  SURGEON:  Antony Contras, MD  ANESTHESIA:  General LMA anesthesia.  COMPLICATIONS:  None.  INDICATION:  The patient is a 29 year old male who has had a cyst in the right lateral nasal wall and premaxilla for quite some time.  He underwent an excision in [place] that helped for some time, but the cyst recurred and draining within the nose during that time.  With persistence of the cyst, he presents to the operating room for surgical management.  FINDINGS:  There was a soft fluid collection in the premaxilla that was palpable through the gingivobuccal sulcus superiorly as well as protruded into the lateral nasal wall.  Excision involved removal of an island of mucosa from the lateral nasal wall along with the cyst which dissected freely from deep structures.  DESCRIPTION OF PROCEDURE:  The patient was identified in the holding room.  Informed consent having been obtained after discussion of risks, benefits, alternatives, the patient was brought to the operative suite, and put on the operative table in a supine position.  Anesthesia was induced.  The patient was intubated by the anesthesia team with an LMA without difficulty.  The patient was given intravenous antibiotics during the case.  The left eye was taped closed and the right eye was lubricated.  The face was prepped and draped in sterile fashion.  The right superior gingivobuccal sulcus was injected with 1%  lidocaine with 1:100,000 epinephrine as well as the lateral nasal wall anteriorly.  An incision was made through the mucosa of the gingivobuccal sulcus using a 15-blade scalpel and then Bovie electrocautery was then used to dissect down to the edge of the cyst and around its capsule keeping along the edge of the capsule without penetrating the cyst.  The cystic structure elevated off the maxilla without difficulty and was dissected free from surrounding tissues up towards the lateral nasal wall.  It was able to be dissected free from the alar crease without penetrating the skin. The mucosa was then incised within the nose around an [place] of mucosa that was adherent to the cyst.  Further dissection was then performed in the sublabial approach connecting to the mucosal incisions until the cyst was fully removed.  This was passed to nursing for Pathology.  The mucosa within the nose was then closed loosely with 3-0 Monocryl suture tried to avoid deformity of the nose.  The premaxilla soft tissue was lacking particularly under the ala, so some of the lateral maxillary tissue was then freed up using Bovie electrocautery and pulled more medially and sutured in place using 3-0 Vicryl suture to support the ala.  A quarter-inch Penrose drain was then placed within the  dissection bed deep to the ala and coming out into the mouth and secured at the mucosa using a single 2-0 silk suture.  The sublabial incision was then closed with 3-0 Monocryl in a simple running fashion.  Bacitracin ointment was then added inside the ala and the mouth was suctioned.  He was returned to anesthesia for wake-up and was extubated, and moved to the recovery room in a stable condition.     Antony Contras, MD   ______________________________ Antony Contras, MD    DDB/MEDQ  D:  03/09/2015  T:  03/10/2015  Job:  [id number]  cc:   Antony Contras, MD

## 2015-07-06 ENCOUNTER — Inpatient Hospital Stay (HOSPITAL_COMMUNITY)
Admission: EM | Admit: 2015-07-06 | Discharge: 2015-07-08 | DRG: 639 | Disposition: A | Discharge status: Home / Self Care | Payer: 59 | Attending: Internal Medicine | Admitting: Internal Medicine

## 2015-07-06 ENCOUNTER — Encounter (HOSPITAL_COMMUNITY): Payer: Self-pay | Admitting: *Deleted

## 2015-07-06 ENCOUNTER — Encounter (HOSPITAL_COMMUNITY): Payer: Self-pay | Admitting: Emergency Medicine

## 2015-07-06 ENCOUNTER — Other Ambulatory Visit (HOSPITAL_COMMUNITY)
Admission: RE | Admit: 2015-07-06 | Discharge: 2015-07-06 | Disposition: A | Discharge status: Home / Self Care | Payer: 59 | Source: Ambulatory Visit | Attending: Family Medicine | Admitting: Family Medicine

## 2015-07-06 ENCOUNTER — Emergency Department (HOSPITAL_COMMUNITY)
Admission: EM | Admit: 2015-07-06 | Discharge: 2015-07-06 | Disposition: A | Discharge status: Home / Self Care | Payer: 59 | Source: Home / Self Care | Attending: Family Medicine | Admitting: Family Medicine

## 2015-07-06 DIAGNOSIS — R739 Hyperglycemia, unspecified: Secondary | ICD-10-CM | POA: Diagnosis not present

## 2015-07-06 DIAGNOSIS — R81 Glycosuria: Secondary | ICD-10-CM

## 2015-07-06 DIAGNOSIS — E1310 Other specified diabetes mellitus with ketoacidosis without coma: Secondary | ICD-10-CM | POA: Diagnosis not present

## 2015-07-06 DIAGNOSIS — R358 Other polyuria: Secondary | ICD-10-CM

## 2015-07-06 DIAGNOSIS — E872 Acidosis, unspecified: Secondary | ICD-10-CM | POA: Diagnosis present

## 2015-07-06 DIAGNOSIS — E119 Type 2 diabetes mellitus without complications: Secondary | ICD-10-CM

## 2015-07-06 DIAGNOSIS — Z79899 Other long term (current) drug therapy: Secondary | ICD-10-CM

## 2015-07-06 DIAGNOSIS — Z6830 Body mass index (BMI) 30.0-30.9, adult: Secondary | ICD-10-CM

## 2015-07-06 DIAGNOSIS — R300 Dysuria: Secondary | ICD-10-CM | POA: Diagnosis present

## 2015-07-06 DIAGNOSIS — R3589 Other polyuria: Secondary | ICD-10-CM

## 2015-07-06 DIAGNOSIS — R824 Acetonuria: Secondary | ICD-10-CM | POA: Diagnosis not present

## 2015-07-06 DIAGNOSIS — Z113 Encounter for screening for infections with a predominantly sexual mode of transmission: Secondary | ICD-10-CM | POA: Insufficient documentation

## 2015-07-06 DIAGNOSIS — I10 Essential (primary) hypertension: Secondary | ICD-10-CM

## 2015-07-06 LAB — POCT I-STAT, CHEM 8
BUN: 9 mg/dL (ref 6–20)
CALCIUM ION: 1.22 mmol/L (ref 1.12–1.23)
CREATININE: 0.8 mg/dL (ref 0.61–1.24)
Chloride: 101 mmol/L (ref 101–111)
GLUCOSE: 376 mg/dL — AB (ref 65–99)
HCT: 49 % (ref 39.0–52.0)
HEMOGLOBIN: 16.7 g/dL (ref 13.0–17.0)
Potassium: 4.1 mmol/L (ref 3.5–5.1)
Sodium: 133 mmol/L — ABNORMAL LOW (ref 135–145)
TCO2: 21 mmol/L (ref 0–100)

## 2015-07-06 LAB — BASIC METABOLIC PANEL
Anion gap: 19 — ABNORMAL HIGH (ref 5–15)
BUN: 9 mg/dL (ref 6–20)
CO2: 16 mmol/L — ABNORMAL LOW (ref 22–32)
CREATININE: 0.84 mg/dL (ref 0.61–1.24)
Calcium: 9.4 mg/dL (ref 8.9–10.3)
Chloride: 95 mmol/L — ABNORMAL LOW (ref 101–111)
GFR calc Af Amer: >60 mL/min (ref >60)
Glucose, Bld: 344 mg/dL — ABNORMAL HIGH (ref 65–99)
Potassium: 4 mmol/L (ref 3.5–5.1)
SODIUM: 130 mmol/L — AB (ref 135–145)

## 2015-07-06 LAB — URINALYSIS, ROUTINE W REFLEX MICROSCOPIC
BILIRUBIN URINE: NEGATIVE
Glucose, UA: >1000 mg/dL — AB
HGB URINE DIPSTICK: NEGATIVE
Ketones, ur: >80 mg/dL — AB
Leukocytes, UA: NEGATIVE
Nitrite: NEGATIVE
PH: 5 (ref 5.0–8.0)
Protein, ur: 30 mg/dL — AB
SPECIFIC GRAVITY, URINE: 1.03 (ref 1.005–1.030)
UROBILINOGEN UA: 0.2 mg/dL (ref 0.0–1.0)

## 2015-07-06 LAB — POCT URINALYSIS DIP (DEVICE)
Bilirubin Urine: NEGATIVE
Glucose, UA: 500 mg/dL — AB
HGB URINE DIPSTICK: NEGATIVE
Ketones, ur: >=160 mg/dL — AB
Leukocytes, UA: NEGATIVE
NITRITE: NEGATIVE
PH: 5 (ref 5.0–8.0)
PROTEIN: NEGATIVE mg/dL
SPECIFIC GRAVITY, URINE: 1.02 (ref 1.005–1.030)
UROBILINOGEN UA: 0.2 mg/dL (ref 0.0–1.0)

## 2015-07-06 LAB — CBC
HCT: 44.6 % (ref 39.0–52.0)
Hemoglobin: 16 g/dL (ref 13.0–17.0)
MCH: 33.9 pg (ref 26.0–34.0)
MCHC: 35.9 g/dL (ref 30.0–36.0)
MCV: 94.5 fL (ref 78.0–100.0)
PLATELETS: 145 10*3/uL — AB (ref 150–400)
RBC: 4.72 MIL/uL (ref 4.22–5.81)
RDW: 13.5 % (ref 11.5–15.5)
WBC: 7.2 10*3/uL (ref 4.0–10.5)

## 2015-07-06 LAB — I-STAT VENOUS BLOOD GAS, ED
Acid-base deficit: 11 mmol/L — ABNORMAL HIGH (ref 0.0–2.0)
Bicarbonate: 16 mEq/L — ABNORMAL LOW (ref 20.0–24.0)
O2 Saturation: 47 %
PH VEN: 7.244 — AB (ref 7.250–7.300)
PO2 VEN: 30 mmHg (ref 30.0–45.0)
TCO2: 17 mmol/L (ref 0–100)
pCO2, Ven: 37.1 mmHg — ABNORMAL LOW (ref 45.0–50.0)

## 2015-07-06 LAB — URINE MICROSCOPIC-ADD ON

## 2015-07-06 LAB — CBG MONITORING, ED: Glucose-Capillary: 262 mg/dL — ABNORMAL HIGH (ref 65–99)

## 2015-07-06 MED ORDER — SODIUM CHLORIDE 0.9 % IV BOLUS (SEPSIS)
1000.0000 mL | Freq: Once | INTRAVENOUS | Status: AC
  Administered 2015-07-06: 1000 mL via INTRAVENOUS

## 2015-07-06 MED ORDER — POTASSIUM CHLORIDE CRYS ER 20 MEQ PO TBCR
20.0000 meq | EXTENDED_RELEASE_TABLET | Freq: Once | ORAL | Status: AC
  Administered 2015-07-06: 20 meq via ORAL
  Filled 2015-07-06: qty 1

## 2015-07-06 MED ORDER — INSULIN ASPART 100 UNIT/ML ~~LOC~~ SOLN
10.0000 [IU] | Freq: Once | SUBCUTANEOUS | Status: AC
  Administered 2015-07-06: 10 [IU] via INTRAVENOUS
  Filled 2015-07-06: qty 1

## 2015-07-06 NOTE — ED Provider Notes (Signed)
CSN: [id number]     Arrival date & time 07/06/15  1707 History   First MD Initiated Contact with Patient 07/06/15 2046     Chief Complaint  Patient presents with  . Hyperglycemia     (Consider location/radiation/quality/duration/timing/severity/associated sxs/prior Treatment) Patient is a 29 y.o. male presenting with hyperglycemia. The history is provided by the patient. No language interpreter was used.  Hyperglycemia Associated symptoms: dysuria and polyuria   Associated symptoms: no abdominal pain, no chest pain, no fever, no nausea, no shortness of breath and no vomiting    Mr. Lohmeyer is a 29 y.o male with a history of hypertenison who presents with frequent urination that has been intermittent for the past several days. He states that he thought it was because he was drinking a lot of soda recently. He also complains of burning at the end of the urine stream and a dry mouth. He denies taking anything prior to arrival. He states that he was seen at urgent care for his symptoms and sent to the ED. He reports that his brother, his mother, and father all have diabetes. He also reports an unintentional 22 pound weight loss in the past couple of months. He denies any recent illness, vision changes, fever, chills, cough, chest pain, abdominal pain, nausea, vomiting, diarrhea, constipation. Past Medical History  Diagnosis Date  . Hypertension     states under control with med., has been on med. > 3 yr.  . Seasonal allergies   . Cyst, nasolabial 02/2015    right lateral   Past Surgical History  Procedure Laterality Date  . Craniotomy  age 81    head trauma - fell from horse  . Cyst excision Right     nose  . Excision nasal mass Right 03/09/2015    Procedure: SUBLABIAL EXCISION OF LATERAL NASAL CYST ;  Surgeon: Christia Reading, MD;  Location: [facility];  Service: ENT;  Laterality: Right;  nasal and oral   Family History  Problem Relation Age of Onset  . Diabetes  Father    Social History  Substance Use Topics  . Smoking status: Never Smoker   . Smokeless tobacco: Never Used  . Alcohol Use: Yes     Comment: occasionally    Review of Systems  Constitutional: Negative for fever.  Respiratory: Negative for cough and shortness of breath.   Cardiovascular: Negative for chest pain.  Gastrointestinal: Negative for nausea, vomiting, abdominal pain and diarrhea.  Endocrine: Positive for polyuria.  Genitourinary: Positive for dysuria and frequency.  All other systems reviewed and are negative.     Allergies  Review of patient's allergies indicates no known allergies.  Home Medications   Prior to Admission medications   Medication Sig Start Date End Date Taking? Authorizing Provider  lisinopril (PRINIVIL,ZESTRIL) 20 MG tablet Take 20 mg by mouth daily.   Yes Historical Provider, MD  cephALEXin (KEFLEX) 500 MG capsule Take 1 capsule (500 mg total) by mouth 3 (three) times daily. Patient not taking: Reported on 07/06/2015 03/09/15   Christia Reading, MD  HYDROcodone-acetaminophen (NORCO/VICODIN) 5-325 MG per tablet Take 1-2 tablets by mouth every 6 (six) hours as needed for moderate pain. Patient not taking: Reported on 07/06/2015 03/09/15   Christia Reading, MD   BP 130/75 mmHg  Pulse 96  Temp(Src) 98.8 F (37.1 C) (Oral)  Resp 18  Ht  (1.753 m)  Wt 208 lb 5.4 oz (94.5 kg)  BMI 30.75 kg/m2  SpO2 100% Physical Exam  Constitutional:  He is oriented to person, place, and time. He appears well-developed and well-nourished.  HENT:  Head: Normocephalic and atraumatic.  Dry mucous membranes.  Eyes: Conjunctivae are normal.  Neck: Normal range of motion. Neck supple.  Cardiovascular: Normal rate, regular rhythm and normal heart sounds.   Pulmonary/Chest: Effort normal and breath sounds normal.  Abdominal: Soft. He exhibits no distension. There is no tenderness. There is no rebound and no guarding.  Well-appearing and in no acute distress.  Ambulatory with steady gait. Abdomen is soft and nontender. Nondistended. No guarding or rebound.  Musculoskeletal: Normal range of motion.  Neurological: He is alert and oriented to person, place, and time.  GCS of 15. No sensory or motor deficit.  Skin: Skin is warm and dry.  Nursing note and vitals reviewed.   ED Course  Procedures (including critical care time) Labs Review Labs Reviewed  BASIC METABOLIC PANEL - Abnormal; Notable for the following:    Sodium 130 (*)    Chloride 95 (*)    CO2 16 (*)    Glucose, Bld 344 (*)    Anion gap 19 (*)    All other components within normal limits  CBC - Abnormal; Notable for the following:    Platelets 145 (*)    All other components within normal limits  URINALYSIS, ROUTINE W REFLEX MICROSCOPIC (NOT AT [facility]) - Abnormal; Notable for the following:    Glucose, UA >1000 (*)    Ketones, ur >80 (*)    Protein, ur 30 (*)    All other components within normal limits  CBG MONITORING, ED - Abnormal; Notable for the following:    Glucose-Capillary 262 (*)    All other components within normal limits  I-STAT VENOUS BLOOD GAS, ED - Abnormal; Notable for the following:    pH, Ven 7.244 (*)    pCO2, Ven 37.1 (*)    Bicarbonate 16.0 (*)    Acid-base deficit 11.0 (*)    All other components within normal limits  URINE MICROSCOPIC-ADD ON  BLOOD GAS, VENOUS    Imaging Review No results found. I have personally reviewed and evaluated these lab results as part of my medical decision-making.   EKG Interpretation None      MDM   Final diagnoses:  Hyperglycemia  Patient presents from urgent care for hyperglycemia after intermittent urinary frequency for the past couple of days. He is in no distress. His exam is normal and he has no complaints of cough, recent illness, abdominal pain, nausea, or vomiting.  His UA is negative for UTI. His anion gap is 19 and glucose is 344. Will start fluids and insulin. I discussed with the patient that he  will need admission and he agrees with the plan.  I called the hospitalist, Dr. Arlean Hopping, who will admit to MedSurg for hyperglycemia/new onset diabetes.    Catha Gosselin, PA-C 07/07/15 2956  Gerhard Munch, MD 07/07/15 [phone]

## 2015-07-06 NOTE — ED Notes (Signed)
Pt went to ucc due to frequent urination. Sent here for newly diagnosed diabetes, cbg 376 at ucc and ketones >160. Pt denies n/v. No acute distress noted.

## 2015-07-06 NOTE — ED Notes (Signed)
Reports pain with urination x 3 days.  Patient is also having frequent urination

## 2015-07-06 NOTE — ED Provider Notes (Signed)
CSN: [id number]     Arrival date & time 07/06/15  1412 History   First MD Initiated Contact with Patient 07/06/15 1608     Chief Complaint  Patient presents with  . Dysuria  . Urinary Frequency   (Consider location/radiation/quality/duration/timing/severity/associated sxs/prior Treatment) HPI Comments: 29 year old Hispanic male fluid in English is complaining of urinary frequency for approximately one week. Also minor thirst. He denies actual dysuria but on occasion at the end of urination he will have some mild burning. Denies penile discharge. He states over the past month he has noticed a weight loss but does not know how much. He states he feels generally well. He has no pain. No nausea or vomiting. He states that his father and brother both have diabetes.   Past Medical History  Diagnosis Date  . Hypertension     states under control with med., has been on med. > 3 yr.  . Seasonal allergies   . Cyst, nasolabial 02/2015    right lateral   Past Surgical History  Procedure Laterality Date  . Craniotomy  age 22    head trauma - fell from horse  . Cyst excision Right     nose  . Excision nasal mass Right 03/09/2015    Procedure: SUBLABIAL EXCISION OF LATERAL NASAL CYST ;  Surgeon: Christia Reading, MD;  Location: [facility];  Service: ENT;  Laterality: Right;  nasal and oral   Family History  Problem Relation Age of Onset  . Diabetes Father    Social History  Substance Use Topics  . Smoking status: Never Smoker   . Smokeless tobacco: Never Used  . Alcohol Use: Yes     Comment: occasionally    Review of Systems  Constitutional: Negative for fever, activity change and fatigue.  HENT: Negative.   Respiratory: Negative.  Negative for cough and shortness of breath.   Cardiovascular: Negative for chest pain and leg swelling.  Gastrointestinal: Negative.   Endocrine: Positive for polydipsia and polyuria.  Genitourinary: Negative for urgency, flank pain, discharge,  penile pain and testicular pain.  Musculoskeletal: Negative.   Skin: Negative.   Neurological: Negative.     Allergies  Review of patient's allergies indicates no known allergies.  Home Medications   Prior to Admission medications   Medication Sig Start Date End Date Taking? Authorizing Provider  cephALEXin (KEFLEX) 500 MG capsule Take 1 capsule (500 mg total) by mouth 3 (three) times daily. Patient not taking: Reported on 07/06/2015 03/09/15   Christia Reading, MD  HYDROcodone-acetaminophen (NORCO/VICODIN) 5-325 MG per tablet Take 1-2 tablets by mouth every 6 (six) hours as needed for moderate pain. Patient not taking: Reported on 07/06/2015 03/09/15   Christia Reading, MD  lisinopril (PRINIVIL,ZESTRIL) 20 MG tablet Take 20 mg by mouth daily.    Historical Provider, MD   Meds Ordered and Administered this Visit  Medications - No data to display  BP 162/101 mmHg  Pulse 94  Temp(Src) 98.4 F (36.9 C) (Oral)  Resp 16  SpO2 99% No data found.   Physical Exam  Constitutional: He is oriented to person, place, and time. He appears well-developed and well-nourished. No distress.  Neck: Normal range of motion. Neck supple.  Cardiovascular: Normal rate, regular rhythm and normal heart sounds.   Pulmonary/Chest: Effort normal and breath sounds normal. No respiratory distress. He has no wheezes.  Musculoskeletal: He exhibits no edema.  Neurological: He is alert and oriented to person, place, and time. No cranial nerve deficit. He exhibits  normal muscle tone.  Skin: Skin is warm and dry. No rash noted.  Psychiatric: He has a normal mood and affect.  Nursing note and vitals reviewed.   ED Course  Procedures (including critical care time)  Labs Review Labs Reviewed  POCT URINALYSIS DIP (DEVICE) - Abnormal; Notable for the following:    Glucose, UA 500 (*)    Ketones, ur >=160 (*)    All other components within normal limits  POCT I-STAT, CHEM 8 - Abnormal; Notable for the following:     Sodium 133 (*)    Glucose, Bld 376 (*)    All other components within normal limits  URINE CYTOLOGY ANCILLARY ONLY   Results for orders placed or performed during the hospital encounter of 07/06/15  POCT urinalysis dip (device)  Result Value Ref Range   Glucose, UA 500 (A) NEGATIVE mg/dL   Bilirubin Urine NEGATIVE NEGATIVE   Ketones, ur >=160 (A) NEGATIVE mg/dL   Specific Gravity, Urine 1.020 1.005 - 1.030   Hgb urine dipstick NEGATIVE NEGATIVE   pH 5.0 5.0 - 8.0   Protein, ur NEGATIVE NEGATIVE mg/dL   Urobilinogen, UA 0.2 0.0 - 1.0 mg/dL   Nitrite NEGATIVE NEGATIVE   Leukocytes, UA NEGATIVE NEGATIVE  I-STAT, chem 8  Result Value Ref Range   Sodium 133 (L) 135 - 145 mmol/L   Potassium 4.1 3.5 - 5.1 mmol/L   Chloride 101 101 - 111 mmol/L   BUN 9 6 - 20 mg/dL   Creatinine, Ser 1.61 0.61 - 1.24 mg/dL   Glucose, Bld 096 (H) 65 - 99 mg/dL   Calcium, Ion 0.45 4.09 - 1.23 mmol/L   TCO2 21 0 - 100 mmol/L   Hemoglobin 16.7 13.0 - 17.0 g/dL   HCT 81.1 91.4 - 78.2 %     Imaging Review No results found.   Visual Acuity Review  Right Eye Distance:   Left Eye Distance:   Bilateral Distance:    Right Eye Near:   Left Eye Near:    Bilateral Near:         MDM   1. Polyuria   2. Hyperglycemia   3. Glycosuria   4. Ketonuria    Transfer to [place] for evaluation of symptoms related to newly diagnosed type 2 diabetes mellitus. Blood sugar and urgent cares 376. Urine with greater than 160 ketones. A dirty urine has also been collected    Hayden Rasmussen, NP 07/06/15 1657

## 2015-07-07 ENCOUNTER — Inpatient Hospital Stay (HOSPITAL_COMMUNITY): Payer: 59

## 2015-07-07 DIAGNOSIS — R81 Glycosuria: Secondary | ICD-10-CM | POA: Diagnosis present

## 2015-07-07 DIAGNOSIS — I10 Essential (primary) hypertension: Secondary | ICD-10-CM

## 2015-07-07 DIAGNOSIS — E1310 Other specified diabetes mellitus with ketoacidosis without coma: Secondary | ICD-10-CM | POA: Diagnosis present

## 2015-07-07 DIAGNOSIS — R358 Other polyuria: Secondary | ICD-10-CM | POA: Diagnosis present

## 2015-07-07 DIAGNOSIS — R3589 Other polyuria: Secondary | ICD-10-CM | POA: Diagnosis present

## 2015-07-07 DIAGNOSIS — E872 Acidosis, unspecified: Secondary | ICD-10-CM | POA: Diagnosis present

## 2015-07-07 DIAGNOSIS — Z6830 Body mass index (BMI) 30.0-30.9, adult: Secondary | ICD-10-CM | POA: Diagnosis not present

## 2015-07-07 DIAGNOSIS — Z79899 Other long term (current) drug therapy: Secondary | ICD-10-CM | POA: Diagnosis not present

## 2015-07-07 DIAGNOSIS — E119 Type 2 diabetes mellitus without complications: Secondary | ICD-10-CM | POA: Diagnosis not present

## 2015-07-07 DIAGNOSIS — R300 Dysuria: Secondary | ICD-10-CM | POA: Diagnosis present

## 2015-07-07 DIAGNOSIS — R739 Hyperglycemia, unspecified: Secondary | ICD-10-CM | POA: Diagnosis present

## 2015-07-07 LAB — BASIC METABOLIC PANEL
ANION GAP: 16 — AB (ref 5–15)
ANION GAP: 13 (ref 5–15)
Anion gap: 15 (ref 5–15)
Anion gap: 14 (ref 5–15)
Anion gap: 10 (ref 5–15)
Anion gap: 15 (ref 5–15)
BUN: 8 mg/dL (ref 6–20)
BUN: 6 mg/dL (ref 6–20)
BUN: 5 mg/dL — AB (ref 6–20)
BUN: 5 mg/dL — AB (ref 6–20)
BUN: 6 mg/dL (ref 6–20)
CHLORIDE: 103 mmol/L (ref 101–111)
CHLORIDE: 107 mmol/L (ref 101–111)
CHLORIDE: 106 mmol/L (ref 101–111)
CHLORIDE: 102 mmol/L (ref 101–111)
CHLORIDE: 104 mmol/L (ref 101–111)
CHLORIDE: 100 mmol/L — AB (ref 101–111)
CO2: 17 mmol/L — ABNORMAL LOW (ref 22–32)
CO2: 15 mmol/L — ABNORMAL LOW (ref 22–32)
CO2: 18 mmol/L — ABNORMAL LOW (ref 22–32)
CO2: 15 mmol/L — ABNORMAL LOW (ref 22–32)
CO2: 15 mmol/L — ABNORMAL LOW (ref 22–32)
CO2: 17 mmol/L — ABNORMAL LOW (ref 22–32)
Calcium: 9 mg/dL (ref 8.9–10.3)
Calcium: 8.7 mg/dL — ABNORMAL LOW (ref 8.9–10.3)
Calcium: 8.6 mg/dL — ABNORMAL LOW (ref 8.9–10.3)
Calcium: 8.8 mg/dL — ABNORMAL LOW (ref 8.9–10.3)
Calcium: 9.3 mg/dL (ref 8.9–10.3)
Calcium: 9.1 mg/dL (ref 8.9–10.3)
Creatinine, Ser: 0.74 mg/dL (ref 0.61–1.24)
Creatinine, Ser: 0.67 mg/dL (ref 0.61–1.24)
Creatinine, Ser: 0.65 mg/dL (ref 0.61–1.24)
Creatinine, Ser: 0.7 mg/dL (ref 0.61–1.24)
Creatinine, Ser: 0.62 mg/dL (ref 0.61–1.24)
Creatinine, Ser: 0.78 mg/dL (ref 0.61–1.24)
GFR calc Af Amer: >60 mL/min (ref >60)
GFR calc Af Amer: >60 mL/min (ref >60)
GFR calc Af Amer: >60 mL/min (ref >60)
GFR calc Af Amer: >60 mL/min (ref >60)
GFR calc Af Amer: >60 mL/min (ref >60)
GFR calc Af Amer: >60 mL/min (ref >60)
GFR calc non Af Amer: >60 mL/min (ref >60)
GFR calc non Af Amer: >60 mL/min (ref >60)
GFR calc non Af Amer: >60 mL/min (ref >60)
GFR calc non Af Amer: >60 mL/min (ref >60)
GFR calc non Af Amer: >60 mL/min (ref >60)
GFR calc non Af Amer: >60 mL/min (ref >60)
GLUCOSE: 271 mg/dL — AB (ref 65–99)
GLUCOSE: 204 mg/dL — AB (ref 65–99)
GLUCOSE: 165 mg/dL — AB (ref 65–99)
GLUCOSE: 242 mg/dL — AB (ref 65–99)
Glucose, Bld: 262 mg/dL — ABNORMAL HIGH (ref 65–99)
Glucose, Bld: 265 mg/dL — ABNORMAL HIGH (ref 65–99)
POTASSIUM: 4 mmol/L (ref 3.5–5.1)
POTASSIUM: 3.5 mmol/L (ref 3.5–5.1)
POTASSIUM: 3.3 mmol/L — AB (ref 3.5–5.1)
POTASSIUM: 3.4 mmol/L — AB (ref 3.5–5.1)
POTASSIUM: 3.6 mmol/L (ref 3.5–5.1)
POTASSIUM: 3.6 mmol/L (ref 3.5–5.1)
SODIUM: 132 mmol/L — AB (ref 135–145)
SODIUM: 135 mmol/L (ref 135–145)
SODIUM: 130 mmol/L — AB (ref 135–145)
Sodium: 135 mmol/L (ref 135–145)
Sodium: 136 mmol/L (ref 135–145)
Sodium: 134 mmol/L — ABNORMAL LOW (ref 135–145)

## 2015-07-07 LAB — URINE CYTOLOGY ANCILLARY ONLY
Chlamydia: NEGATIVE
Neisseria Gonorrhea: NEGATIVE
Trichomonas: NEGATIVE

## 2015-07-07 LAB — TSH: TSH: 5.145 u[IU]/mL — ABNORMAL HIGH (ref 0.350–4.500)

## 2015-07-07 LAB — GLUCOSE, CAPILLARY
GLUCOSE-CAPILLARY: 261 mg/dL — AB (ref 65–99)
GLUCOSE-CAPILLARY: 238 mg/dL — AB (ref 65–99)
GLUCOSE-CAPILLARY: 158 mg/dL — AB (ref 65–99)
GLUCOSE-CAPILLARY: 231 mg/dL — AB (ref 65–99)
Glucose-Capillary: 251 mg/dL — ABNORMAL HIGH (ref 65–99)
Glucose-Capillary: 203 mg/dL — ABNORMAL HIGH (ref 65–99)
Glucose-Capillary: 221 mg/dL — ABNORMAL HIGH (ref 65–99)
Glucose-Capillary: 172 mg/dL — ABNORMAL HIGH (ref 65–99)
Glucose-Capillary: 255 mg/dL — ABNORMAL HIGH (ref 65–99)
Glucose-Capillary: 247 mg/dL — ABNORMAL HIGH (ref 65–99)

## 2015-07-07 LAB — CORTISOL: Cortisol, Plasma: 14 ug/dL

## 2015-07-07 LAB — MAGNESIUM: MAGNESIUM: 2 mg/dL (ref 1.7–2.4)

## 2015-07-07 MED ORDER — LISINOPRIL 20 MG PO TABS
20.0000 mg | ORAL_TABLET | Freq: Every day | ORAL | Status: DC
  Administered 2015-07-07 – 2015-07-08 (×2): 20 mg via ORAL
  Filled 2015-07-07 (×2): qty 1

## 2015-07-07 MED ORDER — HYDROCODONE-ACETAMINOPHEN 5-325 MG PO TABS: 1.0000 | ORAL_TABLET | Freq: Four times a day (QID) | ORAL | Status: DC | PRN

## 2015-07-07 MED ORDER — POTASSIUM CHLORIDE 10 MEQ/100ML IV SOLN
10.0000 meq | INTRAVENOUS | Status: AC
  Administered 2015-07-07 (×2): 10 meq via INTRAVENOUS
  Filled 2015-07-07 (×2): qty 100

## 2015-07-07 MED ORDER — LIVING WELL WITH DIABETES BOOK
Freq: Once | Status: AC
  Administered 2015-07-07: 1
  Filled 2015-07-07: qty 1

## 2015-07-07 MED ORDER — SODIUM CHLORIDE 0.9 % IV SOLN
INTRAVENOUS | Status: DC
  Administered 2015-07-07: 03:00:00 via INTRAVENOUS

## 2015-07-07 MED ORDER — INSULIN GLARGINE 100 UNIT/ML ~~LOC~~ SOLN
30.0000 [IU] | Freq: Two times a day (BID) | SUBCUTANEOUS | Status: DC
  Administered 2015-07-07 – 2015-07-08 (×3): 30 [IU] via SUBCUTANEOUS
  Filled 2015-07-07 (×4): qty 0.3

## 2015-07-07 MED ORDER — DEXTROSE-NACL 5-0.45 % IV SOLN
INTRAVENOUS | Status: DC
  Administered 2015-07-07: 06:00:00 via INTRAVENOUS

## 2015-07-07 MED ORDER — SODIUM CHLORIDE 0.9 % IV SOLN
INTRAVENOUS | Status: DC
  Administered 2015-07-07 (×2): via INTRAVENOUS

## 2015-07-07 MED ORDER — SODIUM CHLORIDE 0.9 % IV SOLN
INTRAVENOUS | Status: AC
  Administered 2015-07-07: 02:00:00 via INTRAVENOUS

## 2015-07-07 MED ORDER — ENOXAPARIN SODIUM 40 MG/0.4ML ~~LOC~~ SOLN
40.0000 mg | SUBCUTANEOUS | Status: DC
  Administered 2015-07-07 – 2015-07-08 (×2): 40 mg via SUBCUTANEOUS
  Filled 2015-07-07 (×2): qty 0.4

## 2015-07-07 MED ORDER — INSULIN ASPART 100 UNIT/ML ~~LOC~~ SOLN
0.0000 [IU] | Freq: Three times a day (TID) | SUBCUTANEOUS | Status: DC
  Administered 2015-07-07: 8 [IU] via SUBCUTANEOUS
  Administered 2015-07-07 – 2015-07-08 (×3): 5 [IU] via SUBCUTANEOUS

## 2015-07-07 MED ORDER — INSULIN REGULAR HUMAN 100 UNIT/ML IJ SOLN
INTRAMUSCULAR | Status: DC
  Administered 2015-07-07: 2 [IU]/h via INTRAVENOUS
  Administered 2015-07-07: 5.6 [IU]/h via INTRAVENOUS
  Administered 2015-07-07: 4.9 [IU]/h via INTRAVENOUS
  Filled 2015-07-07: qty 2.5

## 2015-07-07 NOTE — H&P (Signed)
[facility] History and Physical  Brad Lieurance AVW:[id number] DOB: 11-07-1985 DOA: 07/06/2015  Referring physician: Dr Artis Flock, ED at [facility] PCP: Thora Lance, MD   Chief Complaint: Polyuria  HPI: Ryan Perez is a 29 y.o. male hispanic male with hx of HTN who presented to ED with 1-2 week hx of polyuria and some dysuria.  Found to have hyperglycemia with glucosuria and ketonuria.  Has lost 15-20 lbs the last couple of months. Works as a Curator, married, no children.  No recent CP, SOB, cough, fevers/ n/v/d, no abd pain/ back pain , no rash or joint pains.     Home meds - lisinopril, norco, Keflex    Where does patient live home Can patient participate in ADLs? yes  Past Medical History  Past Medical History  Diagnosis Date  . Hypertension     states under control with med., has been on med. > 3 yr.  . Seasonal allergies   . Cyst, nasolabial 02/2015    right lateral   Past Surgical History  Past Surgical History  Procedure Laterality Date  . Craniotomy  age 14    head trauma - fell from horse  . Cyst excision Right     nose  . Excision nasal mass Right 03/09/2015    Procedure: SUBLABIAL EXCISION OF LATERAL NASAL CYST ;  Surgeon: Christia Reading, MD;  Location: [facility];  Service: ENT;  Laterality: Right;  nasal and oral   Family History  Family History  Problem Relation Age of Onset  . Diabetes Father     Social History  reports that he has never smoked. He has never used smokeless tobacco. He reports that he drinks alcohol. He reports that he does not use illicit drugs. Allergies No Known Allergies Home medications Prior to Admission medications   Medication Sig Start Date End Date Taking? Authorizing Provider  lisinopril (PRINIVIL,ZESTRIL) 20 MG tablet Take 20 mg by mouth daily.   Yes Historical Provider, MD  cephALEXin (KEFLEX) 500 MG capsule Take 1 capsule (500 mg total) by mouth 3 (three) times daily. Patient not taking:  Reported on 07/06/2015 03/09/15   Christia Reading, MD  HYDROcodone-acetaminophen (NORCO/VICODIN) 5-325 MG per tablet Take 1-2 tablets by mouth every 6 (six) hours as needed for moderate pain. Patient not taking: Reported on 07/06/2015 03/09/15   Christia Reading, MD   Liver Function Tests No results for input(s): AST, ALT, ALKPHOS, BILITOT, PROT, ALBUMIN in the last 168 hours. No results for input(s): LIPASE, AMYLASE in the last 168 hours. CBC  Recent Labs Lab 07/06/15 1624 07/06/15 1910  WBC  --  7.2  HGB 16.7 16.0  HCT 49.0 44.6  MCV  --  94.5  PLT  --  145*   Basic Metabolic Panel  Recent Labs Lab 07/06/15 1624 07/06/15 1910  NA 133* 130*  K 4.1 4.0  CL 101 95*  CO2  --  16*  GLUCOSE 376* 344*  BUN 9 9  CREATININE 0.80 0.84  CALCIUM  --  9.4    Filed Vitals:   07/06/15 2200 07/06/15 2215 07/06/15 2230 07/06/15 2234  BP: 138/95 115/76 123/80 123/80  Pulse: 92 85 84 94  Temp:      TempSrc:      Resp:    18  Height:      Weight:      SpO2: 100% 98% 96% 99%   Exam: Pleasant young Hispanic male, not in distress, calm No rash, cyanosis or gangrene Sclera anicteric, throat  clear No jvd, nodes or bruits Chest clear bilat no rales/ wheezing RRR no RMG Abd soft ntnd mild obesity no hsm or ascites GU normal male Ext no LE or UE edema, no joint effusions Neuro is alert, Ox 3 , nf  Na 130  Glu 344  K 4.0  CO2 16  AG 19  Ca 9.4 WBC 7k   Hb 16  plts 145k UA  > 1000 glucose, >80 ketones, neg LE/nit, 30 protein, 0-2 rbc/wbc  Assessment: 1. Polyuria  / hyperglycemia / new onset diabetes mellitus - mild DKA. Admit, IV insulin drip, telemetry.  Hb A1C, tsh. Diabetic eduction. Transition to oral meds if stabilizing.   2. HTN on ACEi only   Plan - as above.    DVT Prophylaxis lovenox Code Status: full  Family Communication: none here  Disposition Plan: home when stable    Ash Mcelwain D [facility] Pager [phone]  Cell [phone]  If 7PM-7AM, please  contact night-coverage [email or web address] Password [facility] 07/07/2015, 12:33 AM

## 2015-07-07 NOTE — ED Notes (Signed)
Final STD lab screening reports negative for GC/chlamydia, trichomonas. No further action required

## 2015-07-07 NOTE — Progress Notes (Signed)
Patient seen and examined, feeling better, transition off insulin, continue titrate insulin dose, likely able to discharge tomorrow.

## 2015-07-07 NOTE — Progress Notes (Signed)
Pt. Educated about insulin administration. Pt. Able to properly inject self, also able to take blood sugar with glucometer. Diabetic videos started.

## 2015-07-07 NOTE — Plan of Care (Signed)
Problem: Food- and Nutrition-Related Knowledge Deficit (NB-1.1) Goal: Nutrition education Formal process to instruct or train a patient/client in a skill or to impart knowledge to help patients/clients voluntarily manage or modify food choices and eating behavior to maintain or improve health. Outcome: Adequate for Discharge  RD consulted for nutrition education regarding diabetes.   No results found for: HGBA1C  RD provided "Carbohydrate Counting for People with Diabetes" (Spanish version, per pt request) handout from the Academy of Nutrition and Dietetics. Discussed different food groups and their effects on blood sugar, emphasizing carbohydrate-containing foods. Provided list of carbohydrates and recommended serving sizes of common foods.  Discussed importance of controlled and consistent carbohydrate intake throughout the day. Provided examples of ways to balance meals/snacks and encouraged intake of high-fiber, whole grain complex carbohydrates. Teach back method used.  Expect fair to good compliance.  Body mass index is 30.75 kg/(m^2). Pt meets criteria for obesity, class I based on current BMI.  Current diet order is Heart Healthy/ Carb Modified, patient is consuming approximately 75-100% of meals at this time. Labs and medications reviewed. No further nutrition interventions warranted at this time. RD contact information provided. If additional nutrition issues arise, please re-consult RD.  Alena Blankenbeckler A. Mayford Knife, RD, LDN, CDE Pager: [phone] After hours Pager: [phone]

## 2015-07-07 NOTE — Progress Notes (Signed)
Patient able to properly inject insulin to himself .

## 2015-07-07 NOTE — Progress Notes (Signed)
Inpatient Diabetes Program Recommendations  AACE/ADA: New Consensus Statement on Inpatient Glycemic Control (2015)  Target Ranges:  Prepandial:   less than 140 mg/dL      Peak postprandial:   less than 180 mg/dL (1-2 hours)      Critically ill patients:  140 - 180 mg/dL   Review of Glycemic Control  Diabetes history: New onset this admission Outpatient Diabetes medications: none Current orders for Inpatient glycemic control: Lantus 30 units bid and mod corretion tidwc  Inpatient Diabetes Program Recommendations:    Noted patient new onset diabetes. Pt given 10 units lantus prior to discontinuation of the insulin drip. Noted patient now ordered lantus 30 units bid-would recommend 30 units lantus once a day to start.  Ordered dietician consult, Living Well with DM ed'l booklet, and videos. Will order insulin starter kit if patient needs at discharge. No HgbA1C results yet.l  Thank you Rosita Kea, RN, MSN, CDE  Diabetes Inpatient Program Office: [phone] Pager: [phone] 8:00 am to 5:00 pm

## 2015-07-07 NOTE — Progress Notes (Signed)
Nutrition Brief Note  Patient identified on the Malnutrition Screening Tool (MST) Report  Wt Readings from Last 15 Encounters:  07/06/15 208 lb 5.4 oz (94.5 kg)  03/09/15 234 lb 12.8 oz (106.505 kg)  01/09/12 218 lb 6.4 oz (99.066 kg)   Ryan Perez is a 29 y.o. male hispanic male with hx of HTN who presented to ED with 1-2 week hx of polyuria and some dysuria. Found to have hyperglycemia with glucosuria and ketonuria. Has lost 15-20 lbs the last couple of months. Works as a Curator, married, no children. No recent CP, SOB, cough, fevers/ n/v/d, no abd pain/ back pain , no rash or joint pains.   Spoke with pt at bedside. He reports good appetite presently and PTA. He was consuming 3 meals per day. Beverages consisted of mostly water, with juice and regular soda. Pt has a PCP. He complained of having to urinate frequently PTA. Suspect weight loss is associated with new DM diagnosis. Pt with strong family hx of DM in family- confirms mother, father, and older brother all with dx.   Body mass index is 30.75 kg/(m^2). Patient meets criteria for obesity, class I based on current BMI.   Current diet order is Heart Healthy/ Carb Modified, patient is consuming approximately 75-100% of meals at this time. Labs and medications reviewed.   No nutrition interventions warranted at this time. If nutrition issues arise, please consult RD.   Ryan Perez A. Mayford Knife, RD, LDN, CDE Pager: [phone] After hours Pager: [phone]

## 2015-07-08 DIAGNOSIS — R81 Glycosuria: Secondary | ICD-10-CM

## 2015-07-08 DIAGNOSIS — R739 Hyperglycemia, unspecified: Secondary | ICD-10-CM

## 2015-07-08 DIAGNOSIS — E872 Acidosis: Secondary | ICD-10-CM

## 2015-07-08 DIAGNOSIS — E119 Type 2 diabetes mellitus without complications: Secondary | ICD-10-CM

## 2015-07-08 DIAGNOSIS — I10 Essential (primary) hypertension: Secondary | ICD-10-CM

## 2015-07-08 DIAGNOSIS — R358 Other polyuria: Secondary | ICD-10-CM

## 2015-07-08 LAB — COMPREHENSIVE METABOLIC PANEL
ALBUMIN: 4 g/dL (ref 3.5–5.0)
ALK PHOS: 65 U/L (ref 38–126)
ALT: 47 U/L (ref 17–63)
ANION GAP: 14 (ref 5–15)
AST: 26 U/L (ref 15–41)
BILIRUBIN TOTAL: 2.2 mg/dL — AB (ref 0.3–1.2)
BUN: <5 mg/dL — ABNORMAL LOW (ref 6–20)
CALCIUM: 8.9 mg/dL (ref 8.9–10.3)
CO2: 16 mmol/L — ABNORMAL LOW (ref 22–32)
Chloride: 102 mmol/L (ref 101–111)
Creatinine, Ser: 0.69 mg/dL (ref 0.61–1.24)
GFR calc non Af Amer: >60 mL/min (ref >60)
GLUCOSE: 245 mg/dL — AB (ref 65–99)
Potassium: 3.4 mmol/L — ABNORMAL LOW (ref 3.5–5.1)
Sodium: 132 mmol/L — ABNORMAL LOW (ref 135–145)
TOTAL PROTEIN: 6.5 g/dL (ref 6.5–8.1)

## 2015-07-08 LAB — BASIC METABOLIC PANEL
Anion gap: 13 (ref 5–15)
Anion gap: 13 (ref 5–15)
BUN: <5 mg/dL — ABNORMAL LOW (ref 6–20)
CALCIUM: 9.1 mg/dL (ref 8.9–10.3)
CO2: 18 mmol/L — AB (ref 22–32)
CO2: 17 mmol/L — AB (ref 22–32)
CREATININE: 0.77 mg/dL (ref 0.61–1.24)
Calcium: 8.9 mg/dL (ref 8.9–10.3)
Chloride: 99 mmol/L — ABNORMAL LOW (ref 101–111)
Chloride: 101 mmol/L (ref 101–111)
Creatinine, Ser: 0.75 mg/dL (ref 0.61–1.24)
GFR calc Af Amer: >60 mL/min (ref >60)
GFR calc non Af Amer: >60 mL/min (ref >60)
GLUCOSE: 241 mg/dL — AB (ref 65–99)
Glucose, Bld: 326 mg/dL — ABNORMAL HIGH (ref 65–99)
Potassium: 3.6 mmol/L (ref 3.5–5.1)
Potassium: 3.6 mmol/L (ref 3.5–5.1)
Sodium: 130 mmol/L — ABNORMAL LOW (ref 135–145)
Sodium: 131 mmol/L — ABNORMAL LOW (ref 135–145)

## 2015-07-08 LAB — HEMOGLOBIN A1C
HEMOGLOBIN A1C: 10.3 % — AB (ref 4.8–5.6)
MEAN PLASMA GLUCOSE: 249 mg/dL

## 2015-07-08 LAB — LIPID PANEL
Cholesterol: 278 mg/dL — ABNORMAL HIGH (ref 0–200)
HDL: 29 mg/dL — ABNORMAL LOW (ref >40)
LDL CALC: UNDETERMINED mg/dL (ref 0–99)
TRIGLYCERIDES: 933 mg/dL — AB (ref <150)
Total CHOL/HDL Ratio: 9.6 RATIO
VLDL: UNDETERMINED mg/dL (ref 0–40)

## 2015-07-08 LAB — GLUCOSE, CAPILLARY
GLUCOSE-CAPILLARY: 246 mg/dL — AB (ref 65–99)
GLUCOSE-CAPILLARY: 220 mg/dL — AB (ref 65–99)

## 2015-07-08 LAB — T4, FREE: Free T4: 0.81 ng/dL (ref 0.61–1.12)

## 2015-07-08 MED ORDER — FENOFIBRATE 160 MG PO TABS
160.0000 mg | ORAL_TABLET | Freq: Every day | ORAL | Status: DC
  Administered 2015-07-08: 160 mg via ORAL
  Filled 2015-07-08: qty 1

## 2015-07-08 MED ORDER — INSULIN STARTER KIT- PEN NEEDLES (ENGLISH)
1.0000 | Freq: Once | Status: AC
  Administered 2015-07-08: 1
  Filled 2015-07-08 (×2): qty 1

## 2015-07-08 MED ORDER — INSULIN GLARGINE 100 UNIT/ML SOLOSTAR PEN: 35.0000 [IU] | PEN_INJECTOR | Freq: Two times a day (BID) | SUBCUTANEOUS | Status: DC

## 2015-07-08 MED ORDER — INSULIN GLARGINE 100 UNIT/ML ~~LOC~~ SOLN
35.0000 [IU] | Freq: Two times a day (BID) | SUBCUTANEOUS | Status: DC
  Filled 2015-07-08: qty 0.35

## 2015-07-08 MED ORDER — INSULIN STARTER KIT- PEN NEEDLES (ENGLISH): 1.0000 | Freq: Once | Status: DC

## 2015-07-08 MED ORDER — INSULIN ASPART 100 UNIT/ML FLEXPEN: 2.0000 [IU] | PEN_INJECTOR | Freq: Three times a day (TID) | SUBCUTANEOUS | Status: DC

## 2015-07-08 MED ORDER — POTASSIUM CHLORIDE CRYS ER 20 MEQ PO TBCR
40.0000 meq | EXTENDED_RELEASE_TABLET | Freq: Once | ORAL | Status: AC
  Administered 2015-07-08: 40 meq via ORAL
  Filled 2015-07-08: qty 2

## 2015-07-08 MED ORDER — BLOOD GLUCOSE MONITOR KIT: PACK | Status: DC

## 2015-07-08 MED ORDER — FENOFIBRATE 160 MG PO TABS: 160.0000 mg | ORAL_TABLET | Freq: Every day | ORAL | Status: AC

## 2015-07-08 MED ORDER — "PEN NEEDLES 3/16"" 31G X 5 MM MISC": Status: DC

## 2015-07-08 NOTE — Progress Notes (Signed)
Inpatient Diabetes Program Recommendations  AACE/ADA: New Consensus Statement on Inpatient Glycemic Control (2015)  Target Ranges:  Prepandial:   less than 140 mg/dL      Peak postprandial:   less than 180 mg/dL (1-2 hours)      Critically ill patients:  140 - 180 mg/dL   Review of Glycemic Control Taught patient how to use the insulin pen, step-by -step using teach back method. Pt very receptive to teaching and demonstrated proper technique for injections. Ordered insulin pen starter kit so that patient will have a visual guide as well. Wrote order for patient to have to take home.  Pt has f/up with [facility] physicians on the 29th of Oct. Reviewed signs and symptoms of hypoglycemia as well. Pt still watching ed'l videos. Wife speaks Spanish- will order ed'l videos in spanish for patient and wife to view as well.  Thank you Rosita Kea, RN, MSN, CDE  Diabetes Inpatient Program Office: [phone] Pager: [phone] 8:00 am to 5:00 pm

## 2015-07-08 NOTE — Progress Notes (Addendum)
NURSING PROGRESS NOTE  Ryan Perez [id number] Discharge Data: 07/08/2015 1:38 PM Attending Provider: No att. providers found NWG:[id number],QMVHQIPCP:EHINGER,ROBERT R, MD   Ryan PraderAndres Perez to be D/C'd Home per MD order with hard copies of all prescriptions, diabetic coordinator consult and nutrition consult completed including all diabetic teaching. Patient escorted out with wife and newborn baby by Ginger Forensic scientistN Charge Nurse.    All IV's will be discontinued and monitored for bleeding.  All belongings will be returned to patient for patient to take home.  Last Documented Vital Signs:  Blood pressure 133/76, pulse 96, temperature 97.8 F (36.6 C), temperature source Oral, resp. rate 14, height 5\' 9"  (1.753 m), weight 94.5 kg (208 lb 5.4 oz), SpO2 100 %.  Leane PlattSpencer Zaylin Pistilli RN, BS, BSN

## 2015-07-08 NOTE — Discharge Instructions (Signed)
Type 1 Diabetes Mellitus, Adult Type 1 diabetes mellitus, often simply referred to as diabetes, is a long-term (chronic) disease. It occurs when the islet cells in the pancreas that make insulin (a hormone) are destroyed and can no longer make insulin. Insulin is needed to move sugars from food into the tissue cells. The tissue cells use the sugars for energy. In people with type 1 diabetes, the sugars build up in the blood instead of going into the tissue cells. As a result, high blood sugar (hyperglycemia) develops. Without insulin, the body breaks down fat cells for the needed energy. This breakdown of fat cells produces acid chemicals (ketones), which increases the acid levels in the body. The effect of either high ketone or high sugar (glucose) levels can be life-threatening.  Type 1 diabetes was also previously called juvenile diabetes. It most often occurs before the age of 39, but it can occur at any age. RISK FACTORS A person is predisposed to developing type 1 diabetes if someone in his or her family has the disease and is exposed to certain additional environmental triggers.  SYMPTOMS  Symptoms of type 1 diabetes may develop gradually over days to weeks or suddenly. The symptoms occur due to hyperglycemia. The symptoms can include:   Increased thirst (polydipsia).  Increased urination (polyuria).  Increased urination during the night (nocturia).  Weight loss. This weight loss may be rapid.  Frequent, recurring infections.  Tiredness (fatigue).  Weakness.  Vision changes, such as blurred vision.  Fruity smell to your breath.  Abdominal pain.  Nausea or vomiting.  An open skin wound (ulcer). DIAGNOSIS  Type 1 diabetes is diagnosed when symptoms of diabetes are present and when blood glucose levels are increased. Your blood glucose level may be checked by one or more of the following blood tests:  A fasting blood glucose test. You will not be allowed to eat for at least 8  hours before a blood sample is taken.  A random blood glucose test. Your blood glucose is checked at any time of the day regardless of when you ate.  A hemoglobin A1c blood glucose test. A hemoglobin A1c test provides information about blood glucose control over the previous 3 months. TREATMENT  Although type 1 diabetes cannot be prevented, it can be managed with insulin, diet, and exercise.  You will need to take insulin daily to keep blood glucose in the desired range.  You will need to match insulin dosing with exercise and healthy food choices. Generally, the goal of treatment is to maintain a pre-meal (preprandial) blood glucose level of 80-130 mg/dL. HOME CARE INSTRUCTIONS   Have your hemoglobin A1c level checked twice a year.  Perform daily blood glucose monitoring as directed by your health care provider.  Monitor urine ketones when you are ill and as directed by your health care provider.  Take your insulin as directed by your health care provider to maintain your blood glucose level in the desired range.  Never run out of insulin. It is needed every day.  Adjust insulin based on your intake of carbohydrates. Carbohydrates can raise blood glucose levels but need to be included in your diet. Carbohydrates provide vitamins, minerals, and fiber, which are an essential part of a healthy diet. Carbohydrates are found in fruits, vegetables, whole grains, dairy products, legumes, and foods containing added sugars.  Eat healthy foods. Alternate 3 meals with 3 snacks.  Maintain a healthy weight.  Carry a medical alert card or wear your medical alert  jewelry.  Carry a 15-gram carbohydrate snack with you at all times to treat low blood glucose (hypoglycemia). Some examples of 15-gram carbohydrate snacks include:  Glucose tablets, 3 or 4.  Glucose gel, 15-gram tube.  Raisins, 2 tablespoons (24 grams).  Jelly beans, 6.  Animal crackers, 8.  Fruit juice, regular soda, or  low-fat milk, 4 ounces (120 mL).  Gummy treats, 9.  Recognize hypoglycemia. Hypoglycemia occurs with blood glucose levels of 70 mg/dL and below. The risk for hypoglycemia increases when fasting or skipping meals, during or after intense exercise, and during sleep. Hypoglycemia symptoms can include:  Tremors or shakes.  Decreased ability to concentrate.  Sweating.  Increased heart rate.  Headache.  Dry mouth.  Hunger.  Irritability.  Anxiety.  Restless sleep.  Altered speech or coordination.  Confusion.  Treat hypoglycemia promptly. If you are alert and able to safely swallow, follow the 15:15 rule:  Take 15-20 grams of rapid-acting glucose or carbohydrate. Rapid-acting options include glucose gel, glucose tablets, or 4 ounces (120 mL) of fruit juice, regular soda, or low-fat milk.  Check your blood glucose level 15 minutes after taking the glucose.  Take 15-20 grams more of glucose if the repeat blood glucose level is still 70 mg/dL or below.  Eat a meal or snack within 1 hour once blood glucose levels return to normal.  Be alert to polyuria and polydipsia, which are early signs of hyperglycemia. An early awareness of hyperglycemia allows for prompt treatment. Treat hyperglycemia as directed by your health care provider.  Exercise regularly as directed by your health care provider. This includes:  Stretching and performing strength training exercises, such as yoga or weight lifting, at least 2 times per week.  Performing a total of at least 150 minutes of moderate-intensity exercise each week, such as brisk walking or water aerobics.  Exercising at least 3 days per week, making sure you allow no more than 2 consecutive days to pass without exercising.  Avoiding long periods of inactivity (90 minutes or more). When you have to spend an extended period of time sitting down, take frequent breaks to walk or stretch.  Adjust your insulin dosing and food intake as needed  if you start a new exercise or sport.  Follow your sick-day plan at any time you are unable to eat or drink as usual.   Do not use any tobacco products including cigarettes, chewing tobacco, or electronic cigarettes. If you need help quitting, ask your health care provider.  Limit alcohol intake to no more than 1 drink per day for nonpregnant women and 2 drinks per day for men. You should drink alcohol only when you are also eating food. Talk with your health care provider about whether alcohol is safe for you. Tell your health care provider if you drink alcohol several times a week.  Keep all follow-up visits as directed by your health care provider.  Schedule an eye exam within 5 years of diagnosis and then annually.  Perform daily skin and foot care. Examine your skin and feet daily for cuts, bruises, redness, nail problems, bleeding, blisters, or sores. A foot exam should be done by a health care provider 5 years after diagnosis, and then every year after the first exam.  Brush your teeth and gums at least twice a day and floss at least once a day. Follow up with your dentist regularly.  Share your diabetes management plan with your workplace or school.  Keep your immunizations up to date. It  is recommended that you receive a flu (influenza) vaccine every year. It is also recommended that you receive a pneumonia (pneumococcal) vaccine. If you are 25 years of age or older and have never received a pneumonia vaccine, this vaccine may be given as a series of two separate shots. Ask your health care provider which additional vaccines may be recommended.  Learn to manage stress.  Obtain ongoing diabetes education and support as needed.  Participate in or seek rehabilitation as needed to maintain or improve independence and quality of life. Request a physical or occupational therapy referral if you are having foot or hand numbness, or difficulties with grooming, dressing, eating, or physical  activity. SEEK MEDICAL CARE IF:   You are unable to eat food or drink fluids for more than 6 hours.  You have nausea and vomiting for more than 6 hours.  Your blood glucose level is over 240 mg/dL.  There is a change in mental status.  You develop an additional serious illness.  You have diarrhea for more than 6 hours.  You have been sick or have had a fever for a couple of days and are not getting better.  You have pain during any physical activity. SEEK IMMEDIATE MEDICAL CARE IF:  You have difficulty breathing.  You have moderate to large ketone levels. MAKE SURE YOU:  Understand these instructions.  Will watch your condition.  Will get help right away if you are not doing well or get worse.   This information is not intended to replace advice given to you by your health care provider. Make sure you discuss any questions you have with your health care provider.   Document Released: 09/09/2000 Document Revised: 06/03/2015 Document Reviewed: 04/10/2012 Elsevier Interactive Patient Education 2016 [employer].   Diabetes Mellitus tipo 1, adultos (Type 1 Diabetes Mellitus, Adult) La diabetes mellitus tipo1, en general conocida solo como diabetes, es una enfermedad a largo plazo (crnica). Se produce cuando las clulas de los islotes del pncreas que producen la insulina (una hormona) estn destruidos y no pueden producirla. La insulina es necesaria para movilizar los azcares de los alimentos a las clulas de los tejidos. Las clulas de los tejidos [employer] azcares para Psychiatrist. En las personas con diabetes tipo 1, los azcares se Office manager de Cytogeneticist a las clulas de los tejidos. Como resultado, se producen niveles altos de Banker (hiperglucemia). Sin insulina, el cuerpo rompe las clulas de grasa para obtener la energa necesaria. Esta ruptura de las clulas de grasa produce sustancias qumicas cidas (cetonas), lo que [employer] de cido en el [place]. [place] del alto nivel de cetonas o de azcar (glucosa) puede ser potencialmente mortal.  La diabetes tipo 1 tambin se denomina diabetes juvenil. Generalmente aparece antes de los 30 aos, pero puede ocurrir a Actuary. FACTORES DE RIESGO Una persona est predispuesta a desarrollar diabetes tipo 1 si alguien en su familia padece dicha enfermedad y est expuesta a ciertos desencadenantes ambientales adicionales.  SNTOMAS  Los sntomas de la diabetes tipo 1 pueden desarrollarse gradualmente [address] o [address], o pueden Education officer, community. Los sntomas se producen debido a la hiperglucemia. Los sntomas pueden ser:   Aumento de la sed (polidipsia).  Aumento de la miccin (poliuria).  Orina con ms frecuencia durante la noche (nocturia).  Prdida de peso. La prdida de peso puede ser muy rpida.  Infecciones frecuentes y recurrentes.  Cansancio (fatiga).  Debilidad.  Cambios  en la visin, como visin borrosa.  Olor a Water quality scientist.  Dolor abdominal.  Nuseas o vmitos.  Una herida abierta en la piel (lcera). DIAGNSTICO  La diabetes tipo 1 se diagnostica cuando hay sntomas de diabetes y [employer] niveles de glucosa en la [place]. El nivel de glucosa en la sangre puede controlarse en uno o ms de los siguientes anlisis de sangre:  Medicin de glucosa en la sangre en [place]. No se le permitir comer durante al menos 8 horas antes de que se tome [place] de [place].  Pruebas al azar de glucosa en la sangre. El nivel de glucosa en la sangre se controla en cualquier momento del da sin importar el momento en que haya comido.  Prueba de A1c (hemoglobina glucosilada) Una prueba de A1c proporciona informacin sobre el control de la glucosa en la sangre durante los ltimos 3 meses. TRATAMIENTO  Aunque no se puede prevenir la diabetes tipo 1, se puede controlar con insulina, dieta y ejercicio.  Tendr que aplicarse  insulina diariamente para [employer] niveles de glucosa en la sangre en el rango deseado.  Usted tendr [employer] dosis de insulina con la actividad fsica y la eleccin de alimentos saludables. Generalmente, el objetivo del tratamiento es que el nivel sanguneo de glucosa antes de las comidas (preprandial) se mantenga entre 80 y 130 mg/dl. INSTRUCCIONES PARA EL CUIDADO EN EL HOGAR   Controle su nivel de hemoglobina A1c dos veces al ao.  Contrlese a diario Air cabin crew de glucosa en la sangre segn las indicaciones de su mdico.  Supervise las cetonas en la orina cuando est enferma y segn las indicaciones de su mdico.  Adminstrese insulina segn las indicaciones de su mdico para Radio producer nivel de glucosa en la sangre en el rango deseado.  Nunca se quede sin insulina. Es necesario que la reciba [employer].  Ajuste la insulina segn la ingesta de hidratos de carbono. Los hidratos de carbono pueden aumentar los niveles de glucosa en la sangre, pero deben incluirse en su dieta. Aportan vitaminas, minerales y [place] que son [place] parte esencial de una dieta saludable. Los hidratos de carbono se encuentran en frutas, verduras, cereales integrales, productos lcteos, legumbres y alimentos que contienen azcares aadidos.  Consuma alimentos saludables. Alterne 3 comidas con 3 colaciones.  Mantenga un peso saludable.  Lleve una tarjeta de alerta mdica o use una pulsera o medalla de alerta mdica.  Lleve con usted una colacin de 15gramos de hidratos de carbono en todo momento para controlar los niveles bajos de glucosa en la sangre (hipoglucemia). Algunos ejemplos de colaciones de 15gramos de hidratos de carbono son los siguientes:  Tabletas de glucosa, 3 o 4.  Gel de glucosa, tubo de 15 gramos.  Pasas de uva, 2 cucharadas (24 gramos).  Caramelos de goma, 6.  Galletas de [place], 8.  Jugo de fruta, gaseosa comn, o [place], 4 onzas (120 ml).  Pastillas de  goma, 9.  Reconocer la hipoglucemia. La hipoglucemia se produce cuando los niveles de glucosa en la sangre son de 70 mg/dl o menos. El riesgo de hipoglucemia aumenta durante el ayuno o cuando se saltea las comidas, durante o despus de Education officer, environmental ejercicio intenso y [place] duerme. Los sntomas de hipoglucemia son:  Temblores o sacudidas.  Disminucin de la capacidad de concentracin.  Sudoracin.  Aumento de la frecuencia cardaca.  Dolor de [place].  Sequedad en la boca.  Hambre.  Irritabilidad.  Ansiedad.  Sueo agitado.  Alteracin del habla o de la coordinacin.  Confusin.  Tratar la hipoglucemia rpidamente. Si usted est alerta y puede tragar con seguridad, siga la regla de 15/15 que consiste en:  [place] [employer] 15 y 20gramos de glucosa de accin rpida o carbohidratos. Las opciones de accin rpida son un gel de glucosa, tabletas de glucosa, o 4 onzas (120 ml) de jugo de frutas, gaseosa comn, o leche baja en grasa.  Compruebe su nivel de glucosa en la sangre 15 minutos despus de tomar la glucosa.  Tome entre 15 y 20 gramos ms de glucosa si el nivel de glucosa en la sangre todava es de 70mg /dl o inferior.  Ingiera una comida o una colacin en el lapso de 1 hora una vez que los niveles de glucosa en la sangre vuelven a la normalidad.  Est alerta a la poliuria y polidipsia, que son los primeros signos de la hiperglucemia. El reconocimiento temprano de la hiperglucemia permite un tratamiento oportuno. Trate la hiperglucemia segn le indic su mdico.  Haga ejercicio regularmente como se lo haya indicado el mdico. Esto incluye lo siguiente:  Estirarse y Education officer, environmental ejercicios de entrenamiento de la fuerza, como yoga o levantamiento de pesas, por lo menos 2 veces por semana.  Estirarse y Education officer, environmental ejercicios de entrenamiento de la fuerza, como yoga o levantamiento de pesas, por lo menos 2 veces por semana.  Hacer ejercicio fsico por lo menos 3 das por semana y no  dejar pasar ms de 2 das seguidos sin ejercitarse.  Evitar los perodos largos de inactividad (90 minutos o ms tiempo). Cuando deba pasar mucho tiempo sentado, haga pausas frecuentes para caminar o estirarse.  Ajuste su dosis de insulina y la ingesta de alimentos, segn sea necesario, si inicia un nuevo ejercicio o deporte.  Siga su plan para los [address] de enfermedad cuando no pueda comer o beber como de [place].  No consuma ningn producto que contenga tabaco, como cigarrillos, tabaco de Theatre manager o Administrator, Civil Service. Si necesita ayuda para dejar de fumar, hable con el mdico.  Limite el consumo de alcohol a no ms de 1 medida por da en las mujeres no embarazadas y 2 medidas en los hombres. Debe beber alcohol solo mientras come. Hable con su mdico acerca de si el alcohol es seguro para usted. Informe a su mdico si bebe alcohol varias veces a la semana.  Concurra a todas las visitas de control como se lo haya indicado el mdico.  Hgase un examen de vista en el lapso de los 5 aos a partir del diagnstico y luego una vez por ao.  Realice diariamente el cuidado de la piel y de los pies. Examine su piel y los pies diariamente para ver si tiene cortes, moretones, enrojecimiento, problemas en las uas, sangrado, ampollas o Advertising account planner. Un mdico debe realizar un examen de los pies 5 aos despus del diagnstico, y luego anualmente despus del Risk manager.  Cepllese los dientes y encas por lo menos dos veces al da y use hilo dental al menos una vez por da. Concurra regularmente a las visitas de control con el dentista.  Comparta su plan de control de diabetes en el trabajo o en la escuela.  Mantngase al da con las vacunas. Se recomienda que se vacune contra la gripe todos los [place]. [place]ems, que se vacune contra la neumona (vacuna antineumoccica). Si es mayor de 26 aos y nunca se Animator la neumona, esta vacuna puede administrarse como una serie de dos vacunas por separado.  Pregntele  al mdico qu otras vacunas se pueden recomendar.  Aprenda a Dealermanejar el estrs.  Obtenga la mayor cantidad posible de informacin sobre la diabetes y solicite ayuda siempre que sea necesario.  Busque programas de rehabilitacin y participe en ellos para mantener o mejorar su independencia y su calidad de vida. Solicite la derivacin a fisioterapia o terapia ocupacional si se le Universal Healthadormece el pie o la mano, o tiene problemas para asearse, vestirse, comer, o durante la Sterlingactividad fsica. SOLICITE ATENCIN MDICA SI:   No puede comer alimentos o beber por ms de 6 horas.  Tuvo nuseas o ha vomitado durante ms de 6 horas.  Su nivel de glucosa en la sangre es mayor de 240 mg/dl.  Presenta algn cambio en el estado mental.  Desarrolla una enfermedad grave adicional.  Tuvo diarrea durante ms de 6 horas.  Ha estado enfermo o ha tenido fiebre durante un par [address] y no mejora.  Siente dolor al practicar cualquier actividad fsica. SOLICITE ATENCIN MDICA DE INMEDIATO SI:  Tiene dificultad para respirar.  Tiene niveles de cetonas moderados a altos. ASEGRESE DE QUE:  Comprende estas instrucciones.  Controlar su afeccin.  Recibir ayuda de inmediato si no mejora o si empeora.   Esta informacin no tiene Theme park managercomo fin reemplazar el consejo del mdico. Asegrese de hacerle al mdico cualquier pregunta que tenga.   Document Released: 09/12/2005 Document Revised: 06/03/2015 Elsevier Interactive Patient Education [employer]2016 Elsevier Inc.

## 2015-07-08 NOTE — Discharge Summary (Signed)
Physician Discharge Summary   Patient ID: Ryan Perez MRN: [id number] DOB/AGE: 06/17/1986 29 y.o.  Admit date: 07/06/2015 Discharge date: 07/08/2015  Primary Care Physician:  Ryan Huh, MD  Discharge Diagnoses:    . new onset diabetes mellitus with DKA  . hypertriglyceridemia . Glucosuria . metabolic syndrome  Consults:  Diabetic coordinator   Recommendations for Outpatient Follow-up:  Please note patient was placed on Lantus 35 units twice a day with sliding scale with meals. Patient is newly diagnosed with diabetes mellitus. His insulin regimen will be adjusted by his PCP.  Please follow hemoglobin A1c, 10.3 on 10/11  Lipid panel showed triglycerides of 933 likely due to metabolic syndrome. Please follow lipid panel. Patient started on TriCor.  TESTS THAT NEED FOLLOW-UP Hemoglobin A1c, lipid panel, LFTs   DIET: Carb modified diet, low-fat diet    Allergies:  No Known Allergies   Discharge Medications:   Medication List    STOP taking these medications        cephALEXin 500 MG capsule  Commonly known as:  KEFLEX     HYDROcodone-acetaminophen 5-325 MG tablet  Commonly known as:  NORCO/VICODIN      TAKE these medications        blood glucose meter kit and supplies Kit  Dispense based on patient and insurance preference. Use up to four times daily as directed. (FOR ICD-9 250.00, 250.01).     fenofibrate 160 MG tablet  Take 1 tablet (160 mg total) by mouth daily.     insulin aspart 100 UNIT/ML FlexPen  Commonly known as:  NOVOLOG FLEXPEN  Inject 2-15 Units into the skin 3 (three) times daily with meals. Sliding scale  CBG 70 - 120: 0 units: CBG 121 - 150: 2 units; CBG 151 - 200: 3 units; CBG 201 - 250: 5 units; CBG 251 - 300: 8 units;CBG 301 - 350: 11 units; CBG 351 - 400: 15 units; CBG > 400 : 15 units and notify your doctor     Insulin Glargine 100 UNIT/ML Solostar Pen  Commonly known as:  LANTUS SOLOSTAR  Inject 35 Units into the  skin 2 (two) times daily.     insulin starter kit- pen needles Misc  1 kit by Other route once.     lisinopril 20 MG tablet  Commonly known as:  PRINIVIL,ZESTRIL  Take 20 mg by mouth daily.     Pen Needles 3/16" 31G X 5 MM Misc  Diagnosis: Insulin-dependent diabetes mellitus         Brief H and P: For complete details please refer to admission H and P, but in briefAndres Perez is a 29 y.o. male hispanic male with hx of HTN who presented to ED with 1-2 week hx of polyuria and some dysuria. Found to have hyperglycemia with glucosuria and ketonuria. Has lost 15-20 lbs the last couple of months. Works as a Dealer, married, no children. No recent CP, SOB, cough, fevers/ n/v/d, no abd pain/ back pain , no rash or joint pains.   Hospital Course:     Diabetes mellitus, new onset ([place]) presenting with polyuria, hyperglycemia, DKA UA positive for ketones, bicarbonate 16, anion gap 19 Patient was placed on IV insulin drip, aggressive IV fluid hydration. He has been transitioned to subcutaneous insulin. Diabetic education and nutrition consult was provided. At this time patient is placed on Lantus 35 units twice a day with sliding scale insulin with meals. Once hemoglobin A1c and blood sugars are much better controlled, patient may have further  adjustment of insulin over transitioned to oral hypoglycemics, will defer to PCP. Patient was given Flex pens prescriptions. He already has appointment with his PCP on 07/23/15. Hemoglobin A1c 10.3.  Hypertriglyceridemia Lipid panel showed cholesterol of 278, triglycerides 933. HDL 29. Patient was recommended low-fat diet, better control of his diabetes, was placed on TriCor.  Hypertension Continue lisinopril  Day of Discharge BP 133/76 mmHg  Pulse 96  Temp(Src) 97.8 F (36.6 C) (Oral)  Resp 14  Ht 5' 9"  (1.753 m)  Wt 94.5 kg (208 lb 5.4 oz)  BMI 30.75 kg/m2  SpO2 100%  Physical Exam: General: Alert and awake oriented x3 not in  any acute distress. HEENT: anicteric sclera, pupils reactive to light and accommodation CVS: S1-S2 clear no murmur rubs or gallops Chest: clear to auscultation bilaterally, no wheezing rales or rhonchi Abdomen: soft nontender, nondistended, normal bowel sounds Extremities: no cyanosis, clubbing or edema noted bilaterally Neuro: Cranial nerves II-XII intact, no focal neurological deficits   The results of significant diagnostics from this hospitalization (including imaging, microbiology, ancillary and laboratory) are listed below for reference.    LAB RESULTS: Basic Metabolic Panel:  Recent Labs Lab 07/07/15 0144  07/08/15 0628 07/08/15 0933  NA 135  < > 132* 131*  K 4.0  < > 3.4* 3.6  CL 103  < > 102 101  CO2 17*  < > 16* 17*  GLUCOSE 271*  < > 245* 326*  BUN 8  < > <5* <5*  CREATININE 0.74  < > 0.69 0.77  CALCIUM 9.0  < > 8.9 9.1  MG 2.0  --   --   --   < > = values in this interval not displayed. Liver Function Tests:  Recent Labs Lab 07/08/15 0628  AST 26  ALT 47  ALKPHOS 65  BILITOT 2.2*  PROT 6.5  ALBUMIN 4.0   No results for input(s): LIPASE, AMYLASE in the last 168 hours. No results for input(s): AMMONIA in the last 168 hours. CBC:  Recent Labs Lab 07/06/15 1624 07/06/15 1910  WBC  --  7.2  HGB 16.7 16.0  HCT 49.0 44.6  MCV  --  94.5  PLT  --  145*   Cardiac Enzymes: No results for input(s): CKTOTAL, CKMB, CKMBINDEX, TROPONINI in the last 168 hours. BNP: Invalid input(s): POCBNP CBG:  Recent Labs Lab 07/07/15 2207 07/08/15 0829  GLUCAP 247* 246*    Significant Diagnostic Studies:  Portable Chest X-ray (1 View)  07/07/2015  CLINICAL DATA:  Diabetes mellitus and hypertension EXAM: PORTABLE CHEST 1 VIEW COMPARISON:  January 09, 2012 FINDINGS: Lungs are clear. Heart size and pulmonary vascularity are normal. No adenopathy. No bone lesions. IMPRESSION: No edema or consolidation. Electronically Signed   By: Lowella Grip III M.D.   On:  07/07/2015 08:11    2D ECHO:   Disposition and Follow-up:     Discharge Instructions    Amb Referral to Nutrition and Diabetic E    Complete by:  As directed      Diet - low sodium heart healthy    Complete by:  As directed      Diet Carb Modified    Complete by:  As directed      Discharge instructions    Complete by:  As directed   It is VERY IMPORTANT that you follow up with a PCP on a regular basis.  Check your blood glucoses before each meal and at bedtime and maintain a log of your readings.  Bring this log with you when you follow up with your PCP so that he can adjust your insulin at your follow up visit.     Increase activity slowly    Complete by:  As directed             DISPOSITION: Home   DISCHARGE FOLLOW-UP Follow-up Information    Follow up with Ryan Huh, MD On 07/23/2015.   Specialty:  Family Medicine   Why:  Please keep your follow-up appointment as already scheduled. Your insulin may need to be adjusted at the time of follow-up appointment.   Contact information:   [address] [place] [address] [phone]        Time spent on Discharge: 25 minutes  Signed:   Melbert Botelho M.D. [facility] 07/08/2015, 11:58 AM Pager: [phone]

## 2015-07-08 NOTE — Care Management Note (Signed)
Case Management Note  Patient Details  Name: Ryan Perez MRN: [id number] Date of Birth: 1986-02-08  Subjective/Objective:                 Patient from home with wife, independent, insured, has PCP.    Action/Plan:  DC to home, self care.  Expected Discharge Date:  07/09/15               Expected Discharge Plan:  Home/Self Care  In-House Referral:     Discharge planning Services  CM Consult  Post Acute Care Choice:    Choice offered to:     DME Arranged:    DME Agency:     HH Arranged:    HH Agency:     Status of Service:  Completed, signed off  Medicare Important Message Given:    Date Medicare IM Given:    Medicare IM give by:    Date Additional Medicare IM Given:    Additional Medicare Important Message give by:     If discussed at Long Length of Stay Meetings, dates discussed:    Additional Comments:  Ryan SabalDebbie Jaquasia Doscher, RN 07/08/2015, 2:04 PM

## 2015-07-09 LAB — T3, FREE: T3 FREE: 1.9 pg/mL — AB (ref 2.0–4.4)

## 2015-08-04 ENCOUNTER — Ambulatory Visit (INDEPENDENT_AMBULATORY_CARE_PROVIDER_SITE_OTHER): Payer: 59 | Admitting: Endocrinology

## 2015-08-04 VITALS — BP 132/80 | HR 76 | Temp 98.1°F | Ht 69.0 in | Wt 207.0 lb

## 2015-08-04 DIAGNOSIS — E119 Type 2 diabetes mellitus without complications: Secondary | ICD-10-CM | POA: Diagnosis not present

## 2015-08-04 MED ORDER — INSULIN GLARGINE 100 UNIT/ML SOLOSTAR PEN: 30.0000 [IU] | PEN_INJECTOR | Freq: Two times a day (BID) | SUBCUTANEOUS | Status: DC

## 2015-08-04 NOTE — Progress Notes (Signed)
Subjective:    Patient ID: Ryan Perez, male    DOB: 1986-02-21, 29 y.o.   MRN: [id number]  HPI pt states DM was dx'ed 1 month ago, when he presented with DKA; he has mild if any neuropathy of the lower extremities; he is unaware of any associated chronic complications; he has been on insulin since dx; pt says his diet and exercise are good; he has never had pancreatitis or severe hypoglycemia.  He takes lantus and prn novolog.  He says cbg's vary from 66-100.  On Dec 8, he will go to [place], for 3 weeks.   Past Medical History  Diagnosis Date  . Hypertension     states under control with med., has been on med. > 3 yr.  . Seasonal allergies   . Cyst, nasolabial 02/2015    right lateral    Past Surgical History  Procedure Laterality Date  . Craniotomy  age 43    head trauma - fell from horse  . Cyst excision Right     nose  . Excision nasal mass Right 03/09/2015    Procedure: SUBLABIAL EXCISION OF LATERAL NASAL CYST ;  Surgeon: Melida Quitter, MD;  Location: [facility];  Service: ENT;  Laterality: Right;  nasal and oral    Social History   Social History  . Marital Status: Married    Spouse Name: N/A  . Number of Children: N/A  . Years of Education: N/A   Occupational History  . Dealer    Social History Main Topics  . Smoking status: Never Smoker   . Smokeless tobacco: Never Used  . Alcohol Use: Yes     Comment: occasionally  . Drug Use: No  . Sexual Activity: Not on file   Other Topics Concern  . Not on file   Social History Narrative    Current Outpatient Prescriptions on File Prior to Visit  Medication Sig Dispense Refill  . blood glucose meter kit and supplies KIT Dispense based on patient and insurance preference. Use up to four times daily as directed. (FOR ICD-9 250.00, 250.01). 1 each 0  . fenofibrate 160 MG tablet Take 1 tablet (160 mg total) by mouth daily. 30 tablet 3  . insulin aspart (NOVOLOG FLEXPEN) 100 UNIT/ML FlexPen  Inject 2-15 Units into the skin 3 (three) times daily with meals. Sliding scale  CBG 70 - 120: 0 units: CBG 121 - 150: 2 units; CBG 151 - 200: 3 units; CBG 201 - 250: 5 units; CBG 251 - 300: 8 units;CBG 301 - 350: 11 units; CBG 351 - 400: 15 units; CBG > 400 : 15 units and notify your doctor 15 mL 11  . Insulin Pen Needle (PEN NEEDLES 3/16") 31G X 5 MM MISC Diagnosis: Insulin-dependent diabetes mellitus 100 each 2  . insulin starter kit- pen needles MISC 1 kit by Other route once. 1 kit 0  . lisinopril (PRINIVIL,ZESTRIL) 20 MG tablet Take 20 mg by mouth daily.     No current facility-administered medications on file prior to visit.    No Known Allergies  Family History  Problem Relation Age of Onset  . Diabetes Father     BP 132/80 mmHg  Pulse 76  Temp(Src) 98.1 F (36.7 C) (Oral)  Ht 5' 9"  (1.753 m)  Wt 207 lb (93.895 kg)  BMI 30.55 kg/m2  SpO2 99%   Review of Systems denies weight loss, blurry vision, headache, chest pain, sob, n/v, urinary frequency, muscle cramps, excessive diaphoresis, depression,  cold intolerance, rhinorrhea, and easy bruising.      Objective:   Physical Exam VS: see vs page GEN: no distress HEAD: head: no deformity eyes: no periorbital swelling, no proptosis external nose and ears are normal mouth: no lesion seen NECK: supple, thyroid is not enlarged CHEST WALL: no deformity LUNGS: clear to auscultation BREASTS:  No gynecomastia CV: reg rate and rhythm, no murmur.  ABD: abdomen is soft, nontender.  no hepatosplenomegaly.  not distended.  no hernia MUSCULOSKELETAL: muscle bulk and strength are grossly normal.  no obvious joint swelling.  gait is normal and steady EXTEMITIES: no deformity.  no ulcer on the feet.  feet are of normal color and temp.  no edema. There is bilateral onychomycosis of the toenails PULSES: dorsalis pedis intact bilat.  no carotid bruit NEURO:  cn 2-12 grossly intact.   readily moves all 4's.  sensation is intact to touch on  the feet SKIN:  Normal texture and temperature.  No rash or suspicious lesion is visible.   NODES:  None palpable at the neck PSYCH: alert, well-oriented.  Does not appear anxious nor depressed.  i personally reviewed electrocardiogram tracing (03/04/15): Indication: preop Impression: normal  I have reviewed outside records, and summarized: Pt was admitted with new-onset DM , and mild DKA  Lab Results  Component Value Date   HGBA1C 10.3* 07/07/2015      Assessment & Plan:  Type 1 DM: new to me need to see cbg info in order to change to multiple daily injections.     Patient is advised the following: Patient Instructions  good diet and exercise significantly improve the control of your diabetes.  please let me know if you wish to be referred to a dietician.  high blood sugar is very risky to your health.  you should see an eye doctor and dentist every year.  It is very important to get all recommended vaccinations.  controlling your blood pressure and cholesterol drastically reduces the damage diabetes does to your body.  Those who smoke should quit.  please discuss these with your doctor.   check your blood sugar twice a day.  vary the time of day when you check, between before the 3 meals, and at bedtime.  also check if you have symptoms of your blood sugar being too high or too low.  please keep a record of the readings and bring it to your next appointment here (or you can bring the meter itself).  You can write it on any piece of paper.  please call us sooner if your blood sugar goes below 70, or if you have a lot of readings over 200. For now, please reduce the lantus to 30 units twice a day.   we will need to take this complex situation in stages.   Please come back for a follow-up appointment in 2 weeks.

## 2015-08-04 NOTE — Patient Instructions (Addendum)
good diet and exercise significantly improve the control of your diabetes.  please let me know if you wish to be referred to a dietician.  high blood sugar is very risky to your health.  you should see an eye doctor and dentist every year.  It is very important to get all recommended vaccinations.  controlling your blood pressure and cholesterol drastically reduces the damage diabetes does to your body.  Those who smoke should quit.  please discuss these with your doctor.   check your blood sugar twice a day.  vary the time of day when you check, between before the 3 meals, and at bedtime.  also check if you have symptoms of your blood sugar being too high or too low.  please keep a record of the readings and bring it to your next appointment here (or you can bring the meter itself).  You can write it on any piece of paper.  please call us sooner if your blood sugar goes below 70, or if you have a lot of readings over 200. For now, please reduce the lantus to 30 units twice a day.   we will need to take this complex situation in stages.   Please come back for a follow-up appointment in 2 weeks.

## 2015-08-17 ENCOUNTER — Ambulatory Visit (INDEPENDENT_AMBULATORY_CARE_PROVIDER_SITE_OTHER): Payer: 59 | Admitting: Endocrinology

## 2015-08-17 ENCOUNTER — Encounter: Payer: Self-pay | Admitting: Endocrinology

## 2015-08-17 VITALS — BP 112/70 | HR 66 | Temp 97.9°F | Ht 69.0 in | Wt 202.0 lb

## 2015-08-17 DIAGNOSIS — E109 Type 1 diabetes mellitus without complications: Secondary | ICD-10-CM | POA: Diagnosis not present

## 2015-08-17 MED ORDER — INSULIN GLARGINE 100 UNIT/ML SOLOSTAR PEN: 25.0000 [IU] | PEN_INJECTOR | Freq: Two times a day (BID) | SUBCUTANEOUS | Status: DC

## 2015-08-17 NOTE — Progress Notes (Signed)
Subjective:    Patient ID: Ryan Perez, male    DOB: Feb 15, 1986, 29 y.o.   MRN: [id number]  HPI  Pt returns for f/u of diabetes mellitus: DM type: 1 Dx'ed: 7001 Complications: none Therapy: insulin since dx DKA: only at dx Severe hypoglycemia: never Pancreatitis: never Other: he is on BID insulin, for now Interval history: no cbg record, but states cbg's vary from 60-80.  It is in general higher as the day goes on. Past Medical History  Diagnosis Date  . Hypertension     states under control with med., has been on med. > 3 yr.  . Seasonal allergies   . Cyst, nasolabial 02/2015    right lateral    Past Surgical History  Procedure Laterality Date  . Craniotomy  age 36    head trauma - fell from horse  . Cyst excision Right     nose  . Excision nasal mass Right 03/09/2015    Procedure: SUBLABIAL EXCISION OF LATERAL NASAL CYST ;  Surgeon: Melida Quitter, MD;  Location: [facility];  Service: ENT;  Laterality: Right;  nasal and oral    Social History   Social History  . Marital Status: Married    Spouse Name: N/A  . Number of Children: N/A  . Years of Education: N/A   Occupational History  . Dealer    Social History Main Topics  . Smoking status: Never Smoker   . Smokeless tobacco: Never Used  . Alcohol Use: Yes     Comment: occasionally  . Drug Use: No  . Sexual Activity: Not on file   Other Topics Concern  . Not on file   Social History Narrative    Current Outpatient Prescriptions on File Prior to Visit  Medication Sig Dispense Refill  . blood glucose meter kit and supplies KIT Dispense based on patient and insurance preference. Use up to four times daily as directed. (FOR ICD-9 250.00, 250.01). 1 each 0  . fenofibrate 160 MG tablet Take 1 tablet (160 mg total) by mouth daily. 30 tablet 3  . insulin aspart (NOVOLOG FLEXPEN) 100 UNIT/ML FlexPen Inject 2-15 Units into the skin 3 (three) times daily with meals. Sliding scale  CBG 70 -  120: 0 units: CBG 121 - 150: 2 units; CBG 151 - 200: 3 units; CBG 201 - 250: 5 units; CBG 251 - 300: 8 units;CBG 301 - 350: 11 units; CBG 351 - 400: 15 units; CBG > 400 : 15 units and notify your doctor 15 mL 11  . Insulin Pen Needle (PEN NEEDLES 3/16") 31G X 5 MM MISC Diagnosis: Insulin-dependent diabetes mellitus 100 each 2  . insulin starter kit- pen needles MISC 1 kit by Other route once. 1 kit 0  . lisinopril (PRINIVIL,ZESTRIL) 20 MG tablet Take 20 mg by mouth daily.     No current facility-administered medications on file prior to visit.    No Known Allergies  Family History  Problem Relation Age of Onset  . Diabetes Father     BP 112/70 mmHg  Pulse 66  Temp(Src) 97.9 F (36.6 C) (Oral)  Ht 5' 9"  (1.753 m)  Wt 202 lb (91.627 kg)  BMI 29.82 kg/m2  SpO2 99%   Review of Systems Denies LOC    Objective:   Physical Exam VITAL SIGNS:  See vs page GENERAL: no distress SKIN:  Insulin injection sites at the left anterior thigh are normal.        Assessment & Plan:  ADM: overcontrolled.  as we need to reduce insulin, we'll hold off on multiple daily injections for now.    Patient is advised the following: Patient Instructions  check your blood sugar twice a day.  vary the time of day when you check, between before the 3 meals, and at bedtime.  also check if you have symptoms of your blood sugar being too high or too low.  please keep a record of the readings and bring it to your next appointment here (or you can bring the meter itself).  You can write it on any piece of paper.  please call us sooner if your blood sugar goes below 70, or if you have a lot of readings over 200. For now, please reduce the lantus to 25 units twice a day.   Please come back for a follow-up appointment in 2 months.

## 2015-08-17 NOTE — Patient Instructions (Addendum)
check your blood sugar twice a day.  vary the time of day when you check, between before the 3 meals, and at bedtime.  also check if you have symptoms of your blood sugar being too high or too low.  please keep a record of the readings and bring it to your next appointment here (or you can bring the meter itself).  You can write it on any piece of paper.  please call us sooner if your blood sugar goes below 70, or if you have a lot of readings over 200. For now, please reduce the lantus to 25 units twice a day.   Please come back for a follow-up appointment in 2 months.

## 2015-08-18 DIAGNOSIS — E119 Type 2 diabetes mellitus without complications: Secondary | ICD-10-CM | POA: Insufficient documentation

## 2015-08-19 ENCOUNTER — Telehealth: Payer: Self-pay | Admitting: Endocrinology

## 2015-08-19 NOTE — Telephone Encounter (Signed)
Pharmacy already had his medication filled. He wasn't sure what the patient was talking about.

## 2015-08-19 NOTE — Telephone Encounter (Signed)
Patient stated that his medication insulin and needles, he didn't know the name of it was not sent to his pharmacy, he do not have any needles and have one vial of insulin left, please help

## 2015-10-16 ENCOUNTER — Encounter: Payer: Self-pay | Admitting: Endocrinology

## 2015-10-16 ENCOUNTER — Ambulatory Visit (INDEPENDENT_AMBULATORY_CARE_PROVIDER_SITE_OTHER): Payer: BLUE CROSS/BLUE SHIELD | Admitting: Endocrinology

## 2015-10-16 VITALS — BP 132/80 | HR 71 | Temp 98.0°F | Ht 69.0 in | Wt 206.0 lb

## 2015-10-16 DIAGNOSIS — E109 Type 1 diabetes mellitus without complications: Secondary | ICD-10-CM | POA: Diagnosis not present

## 2015-10-16 LAB — POCT GLYCOSYLATED HEMOGLOBIN (HGB A1C): HEMOGLOBIN A1C: 4.6

## 2015-10-16 MED ORDER — INSULIN GLARGINE 100 UNIT/ML SOLOSTAR PEN: 15.0000 [IU] | PEN_INJECTOR | Freq: Two times a day (BID) | SUBCUTANEOUS | Status: DC

## 2015-10-16 NOTE — Progress Notes (Signed)
Subjective:    Patient ID: Ryan Perez, male    DOB: 1986-06-11, 30 y.o.   MRN: [id number]  HPI Pt returns for f/u of diabetes mellitus: DM type: 1 Dx'ed: 2706 Complications: none Therapy: insulin since dx DKA: only at dx Severe hypoglycemia: never Pancreatitis: never Other: he is on BID insulin, for now Interval history: no cbg record, but states cbg's vary from 57-80.  There is no trend throughout the day.  pt states he feels well in general. Past Medical History  Diagnosis Date  . Hypertension     states under control with med., has been on med. > 3 yr.  . Seasonal allergies   . Cyst, nasolabial 02/2015    right lateral    Past Surgical History  Procedure Laterality Date  . Craniotomy  age 53    head trauma - fell from horse  . Cyst excision Right     nose  . Excision nasal mass Right 03/09/2015    Procedure: SUBLABIAL EXCISION OF LATERAL NASAL CYST ;  Surgeon: Melida Quitter, MD;  Location: [facility];  Service: ENT;  Laterality: Right;  nasal and oral    Social History   Social History  . Marital Status: Married    Spouse Name: N/A  . Number of Children: N/A  . Years of Education: N/A   Occupational History  . Dealer    Social History Main Topics  . Smoking status: Never Smoker   . Smokeless tobacco: Never Used  . Alcohol Use: Yes     Comment: occasionally  . Drug Use: No  . Sexual Activity: Not on file   Other Topics Concern  . Not on file   Social History Narrative    Current Outpatient Prescriptions on File Prior to Visit  Medication Sig Dispense Refill  . blood glucose meter kit and supplies KIT Dispense based on patient and insurance preference. Use up to four times daily as directed. (FOR ICD-9 250.00, 250.01). 1 each 0  . fenofibrate 160 MG tablet Take 1 tablet (160 mg total) by mouth daily. 30 tablet 3  . insulin aspart (NOVOLOG FLEXPEN) 100 UNIT/ML FlexPen Inject 2-15 Units into the skin 3 (three) times daily with  meals. Sliding scale  CBG 70 - 120: 0 units: CBG 121 - 150: 2 units; CBG 151 - 200: 3 units; CBG 201 - 250: 5 units; CBG 251 - 300: 8 units;CBG 301 - 350: 11 units; CBG 351 - 400: 15 units; CBG > 400 : 15 units and notify your doctor 15 mL 11  . Insulin Pen Needle (PEN NEEDLES 3/16") 31G X 5 MM MISC Diagnosis: Insulin-dependent diabetes mellitus 100 each 2  . insulin starter kit- pen needles MISC 1 kit by Other route once. 1 kit 0  . lisinopril (PRINIVIL,ZESTRIL) 20 MG tablet Take 20 mg by mouth daily.     No current facility-administered medications on file prior to visit.    No Known Allergies  Family History  Problem Relation Age of Onset  . Diabetes Father     BP 132/80 mmHg  Pulse 71  Temp(Src) 98 F (36.7 C) (Oral)  Ht 5' 9" (1.753 m)  Wt 206 lb (93.441 kg)  BMI 30.41 kg/m2  SpO2 98%    Review of Systems Denies LOC    Objective:   Physical Exam VITAL SIGNS:  See vs page GENERAL: no distress Pulses: dorsalis pedis intact bilat.   MSK: no deformity of the feet CV: no leg edema  Skin:  no ulcer on the feet.  normal color and temp on the feet. Neuro: sensation is intact to touch on the feet.   A1c=4.6%     Assessment & Plan:  Type 1 DM: overcontrolled.  He is in remission or near-remission, but it will recur.  Patient is advised the following: Patient Instructions  check your blood sugar twice a day.  vary the time of day when you check, between before the 3 meals, and at bedtime.  also check if you have symptoms of your blood sugar being too high or too low.  please keep a record of the readings and bring it to your next appointment here (or you can bring the meter itself).  You can write it on any piece of paper.  please call us sooner if your blood sugar goes below 70, or if you have a lot of readings over 200. For now, please reduce the lantus to 15 units twice a day.   Please come back for a follow-up appointment in 1 month.

## 2015-10-16 NOTE — Patient Instructions (Addendum)
check your blood sugar twice a day.  vary the time of day when you check, between before the 3 meals, and at bedtime.  also check if you have symptoms of your blood sugar being too high or too low.  please keep a record of the readings and bring it to your next appointment here (or you can bring the meter itself).  You can write it on any piece of paper.  please call us sooner if your blood sugar goes below 70, or if you have a lot of readings over 200. For now, please reduce the lantus to 15 units twice a day.   Please come back for a follow-up appointment in 1 month.

## 2015-11-09 ENCOUNTER — Telehealth: Payer: Self-pay | Admitting: Endocrinology

## 2015-11-09 NOTE — Telephone Encounter (Signed)
See note below and please advise if ok to refill. Rx is listed under a historical provider.  Thanks! 

## 2015-11-09 NOTE — Telephone Encounter (Signed)
Pt needs refills on fenofibrate called to [facility] on [address] please

## 2015-11-09 NOTE — Telephone Encounter (Signed)
Please send request to PCP, as I rx only the DM.

## 2015-11-09 NOTE — Telephone Encounter (Signed)
Pt advised of note below and voiced understanding.  

## 2015-11-17 ENCOUNTER — Encounter: Payer: Self-pay | Admitting: Endocrinology

## 2015-11-17 ENCOUNTER — Ambulatory Visit (INDEPENDENT_AMBULATORY_CARE_PROVIDER_SITE_OTHER): Payer: BLUE CROSS/BLUE SHIELD | Admitting: Endocrinology

## 2015-11-17 VITALS — BP 138/88 | HR 77 | Temp 98.1°F | Ht 69.0 in | Wt 204.0 lb

## 2015-11-17 DIAGNOSIS — E109 Type 1 diabetes mellitus without complications: Secondary | ICD-10-CM | POA: Diagnosis not present

## 2015-11-17 MED ORDER — INSULIN GLARGINE 100 UNIT/ML SOLOSTAR PEN: 12.0000 [IU] | PEN_INJECTOR | Freq: Two times a day (BID) | SUBCUTANEOUS | Status: DC

## 2015-11-17 NOTE — Progress Notes (Signed)
Subjective:    Patient ID: Ryan Perez, male    DOB: Feb 07, 1986, 30 y.o.   MRN: [id number]  HPI Pt returns for f/u of diabetes mellitus: DM type: 1 Dx'ed: 5681 Complications: none Therapy: insulin since dx DKA: only at dx Severe hypoglycemia: never Pancreatitis: never Other: he is on BID insulin, for now.  Interval history: no cbg record, but states cbg's vary from 80-100.  There is no trend throughout the day.  pt states he feels well in general.   Past Medical History  Diagnosis Date  . Hypertension     states under control with med., has been on med. > 3 yr.  . Seasonal allergies   . Cyst, nasolabial 02/2015    right lateral    Past Surgical History  Procedure Laterality Date  . Craniotomy  age 33    head trauma - fell from horse  . Cyst excision Right     nose  . Excision nasal mass Right 03/09/2015    Procedure: SUBLABIAL EXCISION OF LATERAL NASAL CYST ;  Surgeon: Melida Quitter, MD;  Location: [facility];  Service: ENT;  Laterality: Right;  nasal and oral    Social History   Social History  . Marital Status: Married    Spouse Name: N/A  . Number of Children: N/A  . Years of Education: N/A   Occupational History  . Dealer    Social History Main Topics  . Smoking status: Never Smoker   . Smokeless tobacco: Never Used  . Alcohol Use: Yes     Comment: occasionally  . Drug Use: No  . Sexual Activity: Not on file   Other Topics Concern  . Not on file   Social History Narrative    Current Outpatient Prescriptions on File Prior to Visit  Medication Sig Dispense Refill  . blood glucose meter kit and supplies KIT Dispense based on patient and insurance preference. Use up to four times daily as directed. (FOR ICD-9 250.00, 250.01). 1 each 0  . fenofibrate 160 MG tablet Take 1 tablet (160 mg total) by mouth daily. 30 tablet 3  . Insulin Pen Needle (PEN NEEDLES 3/16") 31G X 5 MM MISC Diagnosis: Insulin-dependent diabetes mellitus 100  each 2  . insulin starter kit- pen needles MISC 1 kit by Other route once. 1 kit 0  . lisinopril (PRINIVIL,ZESTRIL) 20 MG tablet Take 20 mg by mouth daily.     No current facility-administered medications on file prior to visit.    No Known Allergies  Family History  Problem Relation Age of Onset  . Diabetes Father     BP 138/88 mmHg  Pulse 77  Temp(Src) 98.1 F (36.7 C) (Oral)  Ht _0  (1.753 m)  Wt 204 lb (92.534 kg)  BMI 30.11 kg/m2  SpO2 96%  Review of Systems He denies hypoglycemia    Objective:   Physical Exam VITAL SIGNS:  See vs page GENERAL: no distress SKIN:  Insulin injection sites at the anterior abdomen are normal.       Assessment & Plan:  Type 1 DM: overcontrolled.  He is in remission or near-remission, but it will recur.   Patient is advised the following: Patient Instructions  check your blood sugar twice a day.  vary the time of day when you check, between before the 3 meals, and at bedtime.  also check if you have symptoms of your blood sugar being too high or too low.  please keep a record of  the readings and bring it to your next appointment here (or you can bring the meter itself).  You can write it on any piece of paper.  please call us sooner if your blood sugar goes below 70, or if you have a lot of readings over 200. For now, please reduce the lantus to 12 units twice a day.   Please come back for a follow-up appointment in 1 month.

## 2015-11-17 NOTE — Patient Instructions (Addendum)
check your blood sugar twice a day.  vary the time of day when you check, between before the 3 meals, and at bedtime.  also check if you have symptoms of your blood sugar being too high or too low.  please keep a record of the readings and bring it to your next appointment here (or you can bring the meter itself).  You can write it on any piece of paper.  please call us sooner if your blood sugar goes below 70, or if you have a lot of readings over 200. For now, please reduce the lantus to 12 units twice a day.   Please come back for a follow-up appointment in 1 month.

## 2015-11-26 IMAGING — CR DG CHEST 1V PORT
1 series · 1 of 1 positions shown · non-contrast
Comparison: January 09, 2012

CLINICAL DATA: Diabetes mellitus and hypertension

EXAM:
PORTABLE CHEST 1 VIEW

[AP]
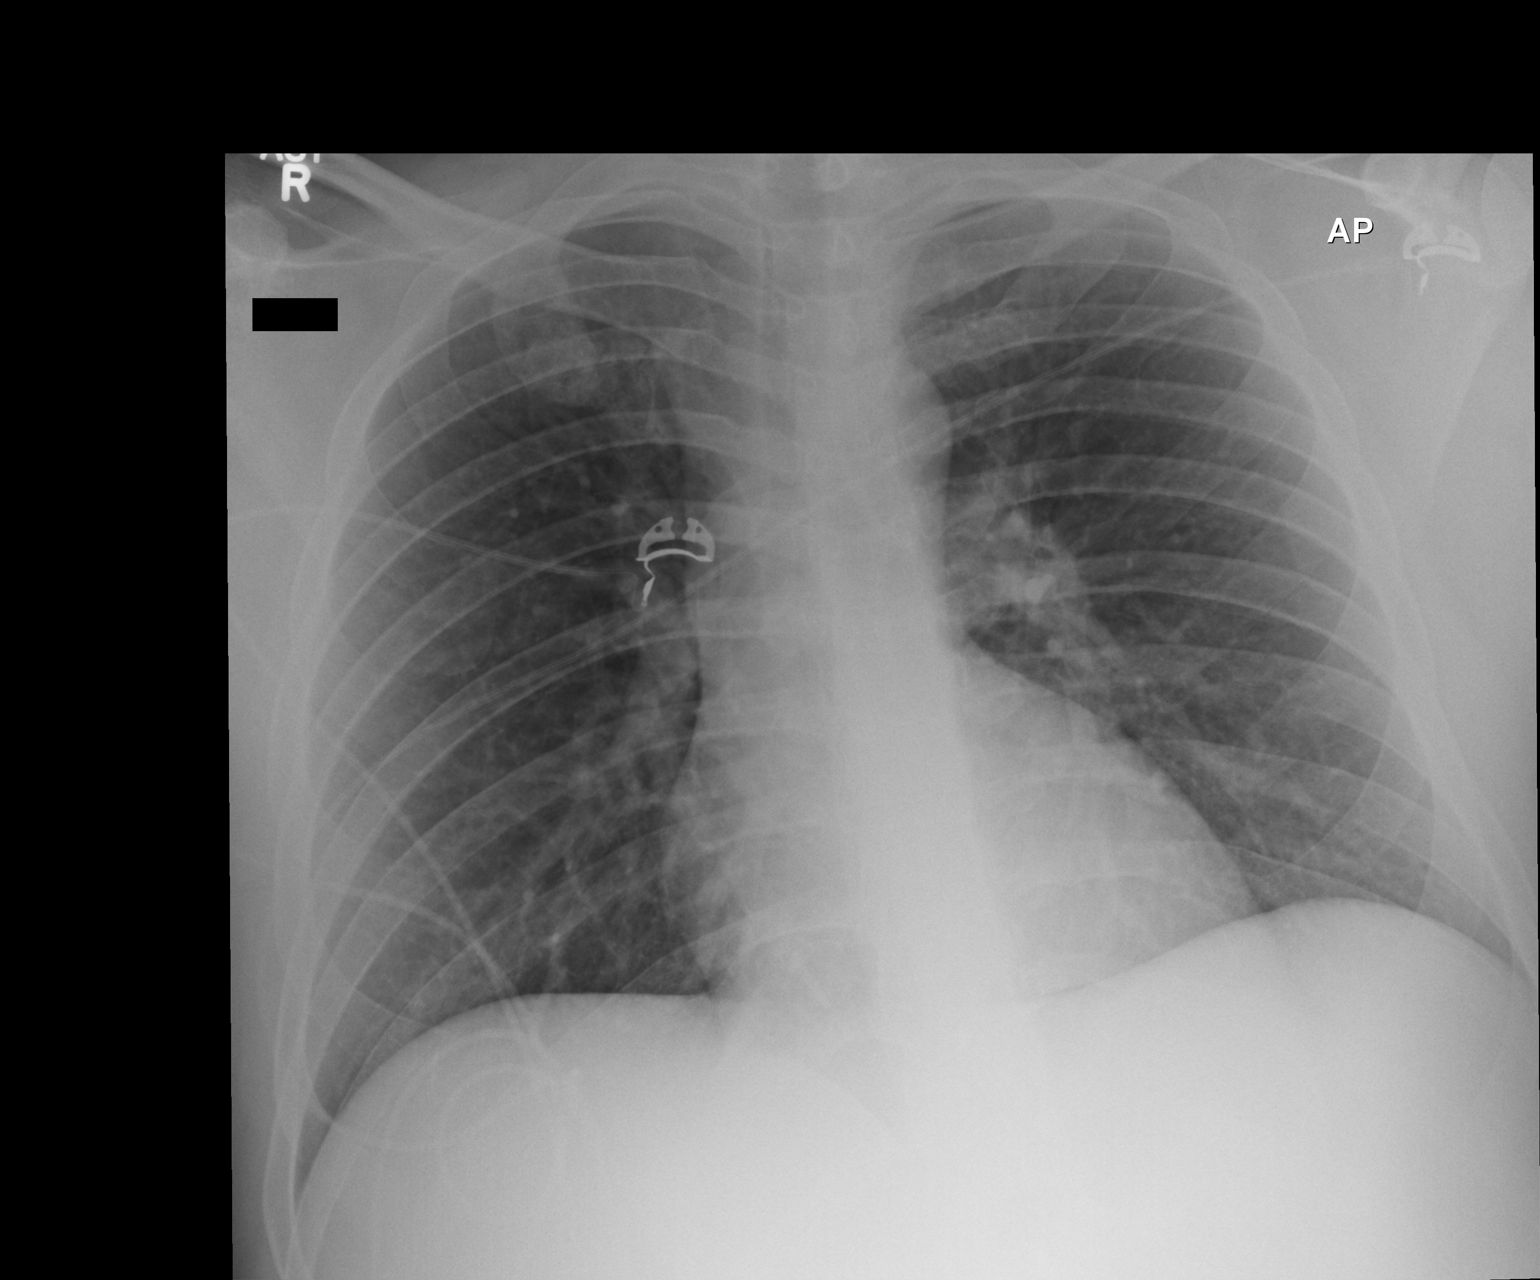

[1 of 1 positions shown; findings below may reference images not displayed]

FINDINGS: Lungs are clear. Heart size and pulmonary vascularity are normal. No
adenopathy. No bone lesions.
IMPRESSION: No edema or consolidation.

## 2015-11-30 ENCOUNTER — Telehealth: Payer: Self-pay | Admitting: Endocrinology

## 2015-11-30 MED ORDER — "PEN NEEDLES 3/16"" 31G X 5 MM MISC": Status: DC

## 2015-11-30 MED ORDER — BLOOD GLUCOSE MONITOR KIT: PACK | Status: DC

## 2015-11-30 NOTE — Telephone Encounter (Signed)
Rx's submitted per pt's request.  

## 2015-11-30 NOTE — Telephone Encounter (Signed)
[facility] on [address] please call in the test strips, lancets, and the pen needles for the lantus

## 2015-12-01 ENCOUNTER — Telehealth: Payer: Self-pay | Admitting: Endocrinology

## 2015-12-01 MED ORDER — BAYER MICROLET LANCETS MISC: Status: DC

## 2015-12-01 MED ORDER — BAYER CONTOUR NEXT EZ W/DEVICE KIT: PACK | Status: AC

## 2015-12-01 MED ORDER — GLUCOSE BLOOD VI STRP: ORAL_STRIP | Status: DC

## 2015-12-01 NOTE — Telephone Encounter (Signed)
Pt said he needs his test strips and lancets sent into his pharmacy

## 2015-12-01 NOTE — Telephone Encounter (Signed)
Rx submitted

## 2015-12-15 ENCOUNTER — Ambulatory Visit (INDEPENDENT_AMBULATORY_CARE_PROVIDER_SITE_OTHER): Payer: BLUE CROSS/BLUE SHIELD | Admitting: Endocrinology

## 2015-12-15 VITALS — BP 118/72 | HR 72 | Temp 98.2°F | Ht 68.0 in | Wt 202.0 lb

## 2015-12-15 DIAGNOSIS — E109 Type 1 diabetes mellitus without complications: Secondary | ICD-10-CM | POA: Diagnosis not present

## 2015-12-15 LAB — POCT GLYCOSYLATED HEMOGLOBIN (HGB A1C): HEMOGLOBIN A1C: 4.6

## 2015-12-15 MED ORDER — INSULIN GLARGINE 100 UNIT/ML SOLOSTAR PEN: 5.0000 [IU] | PEN_INJECTOR | Freq: Two times a day (BID) | SUBCUTANEOUS | Status: DC

## 2015-12-15 NOTE — Progress Notes (Signed)
Subjective:    Patient ID: Ryan Perez, male    DOB: [birth year], 30 y.o.   MRN: [id number]  HPI Pt returns for f/u of diabetes mellitus: DM type: 1 Dx'ed: 9485 Complications: none Therapy: insulin since dx DKA: only at dx Severe hypoglycemia: never Pancreatitis: never Other: he is on BID insulin, for now.  Interval history: no cbg record, but states cbg's vary from 80-100.  There is no trend throughout the day.  pt states he feels well in general.   Past Medical History  Diagnosis Date  . Hypertension     states under control with med., has been on med. > 3 yr.  . Seasonal allergies   . Cyst, nasolabial 02/2015    right lateral    Past Surgical History  Procedure Laterality Date  . Craniotomy  age 30    head trauma - fell from horse  . Cyst excision Right     nose  . Excision nasal mass Right 03/09/2015    Procedure: SUBLABIAL EXCISION OF LATERAL NASAL CYST ;  Surgeon: Melida Quitter, MD;  Location: [facility];  Service: ENT;  Laterality: Right;  nasal and oral    Social History   Social History  . Marital Status: Married    Spouse Name: N/A  . Number of Children: N/A  . Years of Education: N/A   Occupational History  . Dealer    Social History Main Topics  . Smoking status: Never Smoker   . Smokeless tobacco: Never Used  . Alcohol Use: Yes     Comment: occasionally  . Drug Use: No  . Sexual Activity: Not on file   Other Topics Concern  . Not on file   Social History Narrative    Current Outpatient Prescriptions on File Prior to Visit  Medication Sig Dispense Refill  . BAYER MICROLET LANCETS lancets Use to check blood sugar two times per day. Dx Code: E11.9 200 each 2  . blood glucose meter kit and supplies KIT Dispense based on patient and insurance preference. Use up to four times daily as directed. (FOR ICD-10 E11.9) 1 each 0  . Blood Glucose Monitoring Suppl (CONTOUR NEXT EZ MONITOR) w/Device KIT Use to check blood sugar two  times per day. Dx Code: E11.9 1 kit 2  . fenofibrate 160 MG tablet Take 1 tablet (160 mg total) by mouth daily. 30 tablet 3  . glucose blood (BAYER CONTOUR NEXT TEST) test strip Use to check blood sugar two times per day. Dx Code: E11.9 200 each 2  . Insulin Pen Needle (PEN NEEDLES 3/16") 31G X 5 MM MISC Use to inject inulin 2 times per day. 100 each 2  . insulin starter kit- pen needles MISC 1 kit by Other route once. 1 kit 0  . lisinopril (PRINIVIL,ZESTRIL) 20 MG tablet Take 20 mg by mouth daily.     No current facility-administered medications on file prior to visit.    No Known Allergies  Family History  Problem Relation Age of Onset  . Diabetes Father     BP 118/72 mmHg  Pulse 72  Temp(Src) 98.2 F (36.8 C) (Oral)  Ht _0  (1.727 m)  Wt 202 lb (91.627 kg)  BMI 30.72 kg/m2  SpO2 99%   Review of Systems He denies hypoglycemia.     Objective:   Physical Exam VITAL SIGNS: See vs page GENERAL: no distress Pulses: dorsalis pedis intact bilat.  MSK: no deformity of the feet CV: no leg edema.  Skin: no ulcer on the feet. normal color and temp on the feet. Neuro: sensation is intact to touch on the feet. Ext: There is bilateral onychomycosis of the toenails   A1c=4.6%    Assessment & Plan:  DM: still overcontrolled.  He may be in remission.  Patient is advised the following: Patient Instructions  check your blood sugar twice a day.  vary the time of day when you check, between before the 3 meals, and at bedtime.  also check if you have symptoms of your blood sugar being too high or too low.  please keep a record of the readings and bring it to your next appointment here (or you can bring the meter itself).  You can write it on any piece of paper.  please call us sooner if your blood sugar goes below 70, or if you have a lot of readings over 200. For now, please reduce the lantus to 5 units twice a day.   Please come back for a follow-up appointment in 2 months.

## 2015-12-15 NOTE — Patient Instructions (Addendum)
check your blood sugar twice a day.  vary the time of day when you check, between before the 3 meals, and at bedtime.  also check if you have symptoms of your blood sugar being too high or too low.  please keep a record of the readings and bring it to your next appointment here (or you can bring the meter itself).  You can write it on any piece of paper.  please call us sooner if your blood sugar goes below 70, or if you have a lot of readings over 200. For now, please reduce the lantus to 5 units twice a day.   Please come back for a follow-up appointment in 2 months.

## 2016-02-16 ENCOUNTER — Ambulatory Visit (INDEPENDENT_AMBULATORY_CARE_PROVIDER_SITE_OTHER): Payer: BLUE CROSS/BLUE SHIELD | Admitting: Endocrinology

## 2016-02-16 ENCOUNTER — Encounter: Payer: Self-pay | Admitting: Endocrinology

## 2016-02-16 VITALS — BP 138/84 | HR 71 | Temp 98.1°F | Wt 207.0 lb

## 2016-02-16 DIAGNOSIS — E109 Type 1 diabetes mellitus without complications: Secondary | ICD-10-CM | POA: Diagnosis not present

## 2016-02-16 LAB — POCT GLYCOSYLATED HEMOGLOBIN (HGB A1C): Hemoglobin A1C: 4.8

## 2016-02-16 NOTE — Patient Instructions (Addendum)
check your blood sugar twice a day.  vary the time of day when you check, between before the 3 meals, and at bedtime.  also check if you have symptoms of your blood sugar being too high or too low.  please keep a record of the readings and bring it to your next appointment here (or you can bring the meter itself).  You can write it on any piece of paper.  please call us sooner if your blood sugar goes below 70, or if you have a lot of readings over 200. For now, please stop taking the insulin  Please come back for a follow-up appointment in 3 months.    With time, the need for insulin will come back.  good diet and exercise will delay this time.

## 2016-02-16 NOTE — Progress Notes (Signed)
Subjective:    Patient ID: Ryan Perez, male    DOB: [birth year], 30 y.o.   MRN: [id number]  HPI Pt returns for f/u of diabetes mellitus: DM type: 1 Dx'ed: 9622 Complications: none Therapy: insulin since dx DKA: only at dx Severe hypoglycemia: never Pancreatitis: never Other: he is on BID insulin, for now; he entered remission in early 2017 Interval history: no cbg record, but states cbg's vary from 80-93.  There is no trend throughout the day.  pt states he feels well in general.   Past Medical History  Diagnosis Date  . Hypertension     states under control with med., has been on med. > 3 yr.  . Seasonal allergies   . Cyst, nasolabial 02/2015    right lateral    Past Surgical History  Procedure Laterality Date  . Craniotomy  age 36    head trauma - fell from horse  . Cyst excision Right     nose  . Excision nasal mass Right 03/09/2015    Procedure: SUBLABIAL EXCISION OF LATERAL NASAL CYST ;  Surgeon: Melida Quitter, MD;  Location: [facility];  Service: ENT;  Laterality: Right;  nasal and oral    Social History   Social History  . Marital Status: Married    Spouse Name: N/A  . Number of Children: N/A  . Years of Education: N/A   Occupational History  . Dealer    Social History Main Topics  . Smoking status: Never Smoker   . Smokeless tobacco: Never Used  . Alcohol Use: Yes     Comment: occasionally  . Drug Use: No  . Sexual Activity: Not on file   Other Topics Concern  . Not on file   Social History Narrative    Current Outpatient Prescriptions on File Prior to Visit  Medication Sig Dispense Refill  . BAYER MICROLET LANCETS lancets Use to check blood sugar two times per day. Dx Code: E11.9 200 each 2  . blood glucose meter kit and supplies KIT Dispense based on patient and insurance preference. Use up to four times daily as directed. (FOR ICD-10 E11.9) 1 each 0  . Blood Glucose Monitoring Suppl (CONTOUR NEXT EZ MONITOR) w/Device  KIT Use to check blood sugar two times per day. Dx Code: E11.9 1 kit 2  . fenofibrate 160 MG tablet Take 1 tablet (160 mg total) by mouth daily. 30 tablet 3  . glucose blood (BAYER CONTOUR NEXT TEST) test strip Use to check blood sugar two times per day. Dx Code: E11.9 200 each 2  . Insulin Pen Needle (PEN NEEDLES 3/16") 31G X 5 MM MISC Use to inject inulin 2 times per day. 100 each 2  . insulin starter kit- pen needles MISC 1 kit by Other route once. 1 kit 0  . lisinopril (PRINIVIL,ZESTRIL) 20 MG tablet Take 20 mg by mouth daily.     No current facility-administered medications on file prior to visit.    No Known Allergies  Family History  Problem Relation Age of Onset  . Diabetes Father     BP 138/84 mmHg  Pulse 71  Temp(Src) 98.1 F (36.7 C) (Oral)  Wt 207 lb (93.895 kg)  SpO2 98%  Review of Systems He denies hypoglycemia    Objective:   Physical Exam VITAL SIGNS:  See vs page GENERAL: no distress Pulses: dorsalis pedis intact bilat.   MSK: no deformity of the feet CV: no leg edema Skin:  no ulcer on  the feet.  normal color and temp on the feet. Neuro: sensation is intact to touch on the feet  A1c=4.8%     Assessment & Plan:  DM: still overcontrolled.   Patient is advised the following: Patient Instructions  check your blood sugar twice a day.  vary the time of day when you check, between before the 3 meals, and at bedtime.  also check if you have symptoms of your blood sugar being too high or too low.  please keep a record of the readings and bring it to your next appointment here (or you can bring the meter itself).  You can write it on any piece of paper.  please call us sooner if your blood sugar goes below 70, or if you have a lot of readings over 200. For now, please stop taking the insulin  Please come back for a follow-up appointment in 3 months.    With time, the need for insulin will come back.  good diet and exercise will delay this time.

## 2016-05-18 ENCOUNTER — Ambulatory Visit (INDEPENDENT_AMBULATORY_CARE_PROVIDER_SITE_OTHER): Payer: BLUE CROSS/BLUE SHIELD | Admitting: Endocrinology

## 2016-05-18 ENCOUNTER — Encounter: Payer: Self-pay | Admitting: Endocrinology

## 2016-05-18 VITALS — BP 134/84 | HR 71 | Ht 68.0 in | Wt 208.0 lb

## 2016-05-18 DIAGNOSIS — E109 Type 1 diabetes mellitus without complications: Secondary | ICD-10-CM | POA: Diagnosis not present

## 2016-05-18 LAB — POCT GLYCOSYLATED HEMOGLOBIN (HGB A1C): HEMOGLOBIN A1C: 4.9

## 2016-05-18 NOTE — Patient Instructions (Addendum)
check your blood sugar once a week.  vary the time of day when you check, between before the 3 meals, and at bedtime.  also check if you have symptoms of your blood sugar being too high or too low.  please keep a record of the readings and bring it to your next appointment here (or you can bring the meter itself).  You can write it on any piece of paper.  please call us sooner if your blood sugar goes below 70, or if you have a lot of readings over 200. No medication is needed for the diabetes now.   Please come back for a follow-up appointment in 6 months.    With time, the need for insulin will come back.  good diet and exercise will delay this time.

## 2016-05-18 NOTE — Progress Notes (Signed)
Subjective:    Patient ID: Ryan Perez, male    DOB: 1986/03/12, 30 y.o.   MRN: [id number]  HPI Pt returns for f/u of diabetes mellitus: DM type: 1 Dx'ed: 3295 Complications: none Therapy: insulin since dx DKA: only at dx Severe hypoglycemia: never Pancreatitis: never Other: he is on BID insulin, for now; he entered remission in early 2017 Interval history: pt says cbg's are well-controlled  pt states he feels well in general.  Past Medical History:  Diagnosis Date  . Cyst, nasolabial 02/2015   right lateral  . Hypertension    states under control with med., has been on med. > 3 yr.  . Seasonal allergies     Past Surgical History:  Procedure Laterality Date  . CRANIOTOMY  age 53   head trauma - fell from horse  . CYST EXCISION Right    nose  . EXCISION NASAL MASS Right 03/09/2015   Procedure: SUBLABIAL EXCISION OF LATERAL NASAL CYST ;  Surgeon: Melida Quitter, MD;  Location: [facility];  Service: ENT;  Laterality: Right;  nasal and oral    Social History   Social History  . Marital status: Married    Spouse name: N/A  . Number of children: N/A  . Years of education: N/A   Occupational History  . mechanic [employer]   Social History Main Topics  . Smoking status: Never Smoker  . Smokeless tobacco: Never Used  . Alcohol use Yes     Comment: occasionally  . Drug use: No  . Sexual activity: Not on file   Other Topics Concern  . Not on file   Social History Narrative  . No narrative on file    Current Outpatient Prescriptions on File Prior to Visit  Medication Sig Dispense Refill  . BAYER MICROLET LANCETS lancets Use to check blood sugar two times per day. Dx Code: E11.9 200 each 2  . blood glucose meter kit and supplies KIT Dispense based on patient and insurance preference. Use up to four times daily as directed. (FOR ICD-10 E11.9) 1 each 0  . Blood Glucose Monitoring Suppl (CONTOUR NEXT EZ MONITOR) w/Device KIT Use to check blood  sugar two times per day. Dx Code: E11.9 1 kit 2  . fenofibrate 160 MG tablet Take 1 tablet (160 mg total) by mouth daily. 30 tablet 3  . glucose blood (BAYER CONTOUR NEXT TEST) test strip Use to check blood sugar two times per day. Dx Code: E11.9 200 each 2  . lisinopril (PRINIVIL,ZESTRIL) 20 MG tablet Take 20 mg by mouth daily.    . Insulin Pen Needle (PEN NEEDLES 3/16") 31G X 5 MM MISC Use to inject inulin 2 times per day. (Patient not taking: Reported on 05/18/2016) 100 each 2  . insulin starter kit- pen needles MISC 1 kit by Other route once. (Patient not taking: Reported on 05/18/2016) 1 kit 0   No current facility-administered medications on file prior to visit.     No Known Allergies  Family History  Problem Relation Age of Onset  . Diabetes Father     BP 134/84   Pulse 71   Ht 5' 8" (1.727 m)   Wt 208 lb (94.3 kg)   SpO2 99%   BMI 31.63 kg/m    Review of Systems No weight change    Objective:   Physical Exam VITAL SIGNS:  See vs page GENERAL: no distress Pulses: dorsalis pedis intact bilat.   MSK: no deformity of  the feet CV: no leg edema Skin:  no ulcer on the feet.  normal color and temp on the feet. Neuro: sensation is intact to touch on the feet    A1c=4.9%    Assessment & Plan:  Type 1 DM: still in remission

## 2016-11-18 ENCOUNTER — Ambulatory Visit (INDEPENDENT_AMBULATORY_CARE_PROVIDER_SITE_OTHER): Payer: BLUE CROSS/BLUE SHIELD | Admitting: Endocrinology

## 2016-11-18 ENCOUNTER — Encounter: Payer: Self-pay | Admitting: Endocrinology

## 2016-11-18 VITALS — BP 132/84 | HR 82 | Ht 68.0 in | Wt 201.0 lb

## 2016-11-18 DIAGNOSIS — E109 Type 1 diabetes mellitus without complications: Secondary | ICD-10-CM

## 2016-11-18 LAB — POCT GLYCOSYLATED HEMOGLOBIN (HGB A1C): HEMOGLOBIN A1C: 4.9

## 2016-11-18 NOTE — Progress Notes (Signed)
Subjective:    Patient ID: Ryan Perez, male    DOB: 05-22-1986, 31 y.o.   MRN: [id number]  HPI Pt returns for f/u of diabetes mellitus: DM type: 1 Dx'ed: 1062 Complications: none Therapy: none now.  DKA: only at dx Severe hypoglycemia: never Pancreatitis: never Other: he took insulin 2016-2017; he entered remission in early 2017 Interval history: pt says cbg's are well-controlled  pt states he feels well in general.  Past Medical History:  Diagnosis Date  . Cyst, nasolabial 02/2015   right lateral  . Hypertension    states under control with med., has been on med. > 3 yr.  . Seasonal allergies     Past Surgical History:  Procedure Laterality Date  . CRANIOTOMY  age 52   head trauma - fell from horse  . CYST EXCISION Right    nose  . EXCISION NASAL MASS Right 03/09/2015   Procedure: SUBLABIAL EXCISION OF LATERAL NASAL CYST ;  Surgeon: Melida Quitter, MD;  Location: [facility];  Service: ENT;  Laterality: Right;  nasal and oral    Social History   Social History  . Marital status: Married    Spouse name: N/A  . Number of children: N/A  . Years of education: N/A   Occupational History  . mechanic [employer]   Social History Main Topics  . Smoking status: Never Smoker  . Smokeless tobacco: Never Used  . Alcohol use Yes     Comment: occasionally  . Drug use: No  . Sexual activity: Not on file   Other Topics Concern  . Not on file   Social History Narrative  . No narrative on file    Current Outpatient Prescriptions on File Prior to Visit  Medication Sig Dispense Refill  . BAYER MICROLET LANCETS lancets Use to check blood sugar two times per day. Dx Code: E11.9 200 each 2  . blood glucose meter kit and supplies KIT Dispense based on patient and insurance preference. Use up to four times daily as directed. (FOR ICD-10 E11.9) 1 each 0  . Blood Glucose Monitoring Suppl (CONTOUR NEXT EZ MONITOR) w/Device KIT Use to check blood sugar two  times per day. Dx Code: E11.9 1 kit 2  . fenofibrate 160 MG tablet Take 1 tablet (160 mg total) by mouth daily. 30 tablet 3  . glucose blood (BAYER CONTOUR NEXT TEST) test strip Use to check blood sugar two times per day. Dx Code: E11.9 200 each 2  . lisinopril (PRINIVIL,ZESTRIL) 20 MG tablet Take 20 mg by mouth daily.    . Insulin Pen Needle (PEN NEEDLES 3/16") 31G X 5 MM MISC Use to inject inulin 2 times per day. (Patient not taking: Reported on 05/18/2016) 100 each 2  . insulin starter kit- pen needles MISC 1 kit by Other route once. (Patient not taking: Reported on 05/18/2016) 1 kit 0   No current facility-administered medications on file prior to visit.     No Known Allergies  Family History  Problem Relation Age of Onset  . Diabetes Father     BP 132/84   Pulse 82   Ht 5' 8"  (1.727 m)   Wt 201 lb (91.2 kg)   SpO2 98%   BMI 30.56 kg/m    Review of Systems He has lost a few lbs.     Objective:   Physical Exam VITAL SIGNS:  See vs page GENERAL: no distress Pulses: dorsalis pedis intact bilat.   MSK: no deformity  of the feet CV: no leg edema.  Skin:  no ulcer on the feet.  normal color and temp on the feet. Neuro: sensation is intact to touch on the feet.    A1c=4.9%    Assessment & Plan:  Type 1 DM: still in remission Patient is advised the following: Patient Instructions  check your blood sugar once a week.  vary the time of day when you check, between before the 3 meals, and at bedtime.  also check if you have symptoms of your blood sugar being too high or too low.  please keep a record of the readings and bring it to your next appointment here (or you can bring the meter itself).  You can write it on any piece of paper.  please call us sooner if your blood sugar goes below 70, or if you have a lot of readings over 200. No medication is needed for the diabetes now.   Please come back for a follow-up appointment in 6 months.    With time, the need for insulin will  come back.  good diet and exercise will delay this time.

## 2016-11-18 NOTE — Patient Instructions (Signed)
check your blood sugar once a week.  vary the time of day when you check, between before the 3 meals, and at bedtime.  also check if you have symptoms of your blood sugar being too high or too low.  please keep a record of the readings and bring it to your next appointment here (or you can bring the meter itself).  You can write it on any piece of paper.  please call us sooner if your blood sugar goes below 70, or if you have a lot of readings over 200. No medication is needed for the diabetes now.   Please come back for a follow-up appointment in 6 months.    With time, the need for insulin will come back.  good diet and exercise will delay this time.   

## 2017-05-18 ENCOUNTER — Encounter: Payer: Self-pay | Admitting: Endocrinology

## 2017-05-18 ENCOUNTER — Ambulatory Visit (INDEPENDENT_AMBULATORY_CARE_PROVIDER_SITE_OTHER): Payer: BLUE CROSS/BLUE SHIELD | Admitting: Endocrinology

## 2017-05-18 VITALS — BP 124/82 | HR 85 | Wt 211.8 lb

## 2017-05-18 DIAGNOSIS — E109 Type 1 diabetes mellitus without complications: Secondary | ICD-10-CM | POA: Diagnosis not present

## 2017-05-18 LAB — GLUCOSE, POCT (MANUAL RESULT ENTRY): POC GLUCOSE: 4.8 mg/dL — AB (ref 70–99)

## 2017-05-18 MED ORDER — GLUCOSE BLOOD VI STRP: 1.0000 | ORAL_STRIP | 2 refills | Status: AC

## 2017-05-18 NOTE — Progress Notes (Signed)
Subjective:    Patient ID: Ryan Perez, male    DOB: 01-07-1986, 31 y.o.   MRN: [id number]  HPI Pt returns for f/u of diabetes mellitus:  DM type: 1 Dx'ed: 8115 Complications: none Therapy: none now.  DKA: only at dx Severe hypoglycemia: never Pancreatitis: never Other: he took insulin 2016-2017; he entered remission in early 2017.  Interval history: pt says cbg's are well-controlled  pt states he feels well in general.   Past Medical History:  Diagnosis Date  . Cyst, nasolabial 02/2015   right lateral  . Hypertension    states under control with med., has been on med. > 3 yr.  . Seasonal allergies     Past Surgical History:  Procedure Laterality Date  . CRANIOTOMY  age 65   head trauma - fell from horse  . CYST EXCISION Right    nose  . EXCISION NASAL MASS Right 03/09/2015   Procedure: SUBLABIAL EXCISION OF LATERAL NASAL CYST ;  Surgeon: Melida Quitter, MD;  Location: [place] [facility];  Service: ENT;  Laterality: Right;  nasal and oral    Social History   Social History  . Marital status: Married    Spouse name: N/A  . Number of children: N/A  . Years of education: N/A   Occupational History  . mechanic [employer]   Social History Main Topics  . Smoking status: Never Smoker  . Smokeless tobacco: Never Used  . Alcohol use Yes     Comment: occasionally  . Drug use: No  . Sexual activity: Not on file   Other Topics Concern  . Not on file   Social History Narrative  . No narrative on file    Current Outpatient Prescriptions on File Prior to Visit  Medication Sig Dispense Refill  . Blood Glucose Monitoring Suppl (CONTOUR NEXT EZ MONITOR) w/Device KIT Use to check blood sugar two times per day. Dx Code: E11.9 1 kit 2  . fenofibrate 160 MG tablet Take 1 tablet (160 mg total) by mouth daily. 30 tablet 3  . lisinopril (PRINIVIL,ZESTRIL) 20 MG tablet Take 20 mg by mouth daily.     No current facility-administered medications on file prior  to visit.     No Known Allergies  Family History  Problem Relation Age of Onset  . Diabetes Father     BP 124/82   Pulse 85   Wt 211 lb 12.8 oz (96.1 kg)   SpO2 98%   BMI 32.20 kg/m    Review of Systems Denies weight change    Objective:   Physical Exam VITAL SIGNS:  See vs page GENERAL: no distress Pulses: foot pulses are intact bilaterally.   MSK: no deformity of the feet or ankles.  CV: no edema of the legs or ankles Skin:  no ulcer on the feet or ankles.  normal color and temp on the feet and ankles Neuro: sensation is intact to touch on the feet and ankles.     Lab Results  Component Value Date   HGBA1C 4.9 11/18/2016      Assessment & Plan:  Type 1 DM, in remission. HTN: well-controlled. same medication  Patient Instructions  No medication is needed for the diabetes now.  However, the high blood sugar will return at some point. check your blood sugar once a week. also check if you have symptoms of your blood sugar being too high or too low.  please keep a record of the readings and bring it  to your next appointment here (or you can bring the meter itself).  You can write it on any piece of paper.  please call us sooner if you have and reading over 200. Please return in 1 year.

## 2017-05-18 NOTE — Patient Instructions (Addendum)
No medication is needed for the diabetes now.  However, the high blood sugar will return at some point. check your blood sugar once a week. also check if you have symptoms of your blood sugar being too high or too low.  please keep a record of the readings and bring it to your next appointment here (or you can bring the meter itself).  You can write it on any piece of paper.  please call us sooner if you have and reading over 200. Please return in 1 year.

## 2018-05-18 ENCOUNTER — Encounter: Payer: Self-pay | Admitting: Endocrinology

## 2018-05-18 ENCOUNTER — Ambulatory Visit: Payer: BLUE CROSS/BLUE SHIELD | Admitting: Endocrinology

## 2018-05-18 VITALS — BP 146/100 | HR 93 | Ht 68.0 in | Wt 208.6 lb

## 2018-05-18 DIAGNOSIS — E109 Type 1 diabetes mellitus without complications: Secondary | ICD-10-CM | POA: Diagnosis not present

## 2018-05-18 LAB — POCT GLYCOSYLATED HEMOGLOBIN (HGB A1C): HEMOGLOBIN A1C: 4.9 % (ref 4.0–5.6)

## 2018-05-18 NOTE — Progress Notes (Signed)
Subjective:    Patient ID: Ryan Perez, male    DOB: 05/25/1986, 32 y.o.   MRN: [id number]  HPI Pt returns for f/u of diabetes mellitus:  DM type: 1 Dx'ed: 5102 Complications: none Therapy: none now.  DKA: only at dx Severe hypoglycemia: never Pancreatitis: never Other: he took insulin 2016-2017; he entered remission in early 2017.  Interval history: pt says cbg's are well-controlled  pt states he feels well in general.  Past Medical History:  Diagnosis Date  . Cyst, nasolabial 02/2015   right lateral  . Hypertension    states under control with med., has been on med. > 3 yr.  . Seasonal allergies     Past Surgical History:  Procedure Laterality Date  . CRANIOTOMY  age 35   head trauma - fell from horse  . CYST EXCISION Right    nose  . EXCISION NASAL MASS Right 03/09/2015   Procedure: SUBLABIAL EXCISION OF LATERAL NASAL CYST ;  Surgeon: Melida Quitter, MD;  Location: [facility];  Service: ENT;  Laterality: Right;  nasal and oral    Social History   Socioeconomic History  . Marital status: Married    Spouse name: Not on file  . Number of children: Not on file  . Years of education: Not on file  . Highest education level: Not on file  Occupational History  . Occupation: Music therapist: [employer]  Social Needs  . Financial resource strain: Not on file  . Food insecurity:    Worry: Not on file    Inability: Not on file  . Transportation needs:    Medical: Not on file    Non-medical: Not on file  Tobacco Use  . Smoking status: Never Smoker  . Smokeless tobacco: Never Used  Substance and Sexual Activity  . Alcohol use: Yes    Comment: occasionally  . Drug use: No  . Sexual activity: Not on file  Lifestyle  . Physical activity:    Days per week: Not on file    Minutes per session: Not on file  . Stress: Not on file  Relationships  . Social connections:    Talks on phone: Not on file    Gets together: Not on file   Attends religious service: Not on file    Active member of club or organization: Not on file    Attends meetings of clubs or organizations: Not on file    Relationship status: Not on file  . Intimate partner violence:    Fear of current or ex partner: Not on file    Emotionally abused: Not on file    Physically abused: Not on file    Forced sexual activity: Not on file  Other Topics Concern  . Not on file  Social History Narrative  . Not on file    Current Outpatient Medications on File Prior to Visit  Medication Sig Dispense Refill  . Blood Glucose Monitoring Suppl (CONTOUR NEXT EZ MONITOR) w/Device KIT Use to check blood sugar two times per day. Dx Code: E11.9 1 kit 2  . fenofibrate 160 MG tablet Take 1 tablet (160 mg total) by mouth daily. 30 tablet 3  . glucose blood (BAYER CONTOUR NEXT TEST) test strip 1 each by Other route once a week. And lancets 1/week 30 each 2  . lisinopril (PRINIVIL,ZESTRIL) 20 MG tablet Take 20 mg by mouth daily.     No current facility-administered medications on file prior to  visit.     No Known Allergies  Family History  Problem Relation Age of Onset  . Diabetes Father     BP (!) 146/100 (BP Location: Right Arm, Patient Position: Sitting, Cuff Size: Normal)   Pulse 93   Ht 5' 8"  (1.727 m)   Wt 208 lb 9.6 oz (94.6 kg)   SpO2 98%   BMI 31.72 kg/m    Review of Systems No weight change.      Objective:   Physical Exam VITAL SIGNS:  See vs page GENERAL: no distress Pulses: foot pulses are intact bilaterally.   MSK: no deformity of the feet or ankles.  CV: no edema of the legs or ankles Skin:  no ulcer on the feet or ankles.  normal color and temp on the feet and ankles Neuro: sensation is intact to touch on the feet and ankles.     Lab Results  Component Value Date   HGBA1C 4.9 05/18/2018       Assessment & Plan:  Type 1 DM, in remission. HTN: is noted today  Patient Instructions  Your blood pressure is high today.  Please  see your primary care provider soon, to have it rechecked No medication is needed for the diabetes now.  However, the high blood sugar will return at some point.  The best way to delay this is diet and exercise.  check your blood sugar once a week. also check if you have symptoms of your blood sugar being too high or too low.  please keep a record of the readings and bring it to your next appointment here (or you can bring the meter itself).  You can write it on any piece of paper.  please call us sooner if you have and reading over 200.   Please return in 1 year.

## 2018-05-18 NOTE — Patient Instructions (Addendum)
Your blood pressure is high today.  Please see your primary care provider soon, to have it rechecked No medication is needed for the diabetes now.  However, the high blood sugar will return at some point.  The best way to delay this is diet and exercise.  check your blood sugar once a week. also check if you have symptoms of your blood sugar being too high or too low.  please keep a record of the readings and bring it to your next appointment here (or you can bring the meter itself).  You can write it on any piece of paper.  please call us sooner if you have and reading over 200.   Please return in 1 year.

## 2019-05-21 ENCOUNTER — Ambulatory Visit: Payer: BLUE CROSS/BLUE SHIELD | Admitting: Endocrinology
